# Patient Record
Sex: Female | Born: 1980 | Race: White | Hispanic: No | Smoking: Former smoker
Health system: Southern US, Community
[De-identification: ages and names within clinical notes are randomized; demographics above are authoritative.]

## PROBLEM LIST (undated history)

## (undated) DIAGNOSIS — M1990 Unspecified osteoarthritis, unspecified site: Secondary | ICD-10-CM

## (undated) DIAGNOSIS — R011 Cardiac murmur, unspecified: Secondary | ICD-10-CM

## (undated) DIAGNOSIS — R3915 Urgency of urination: Secondary | ICD-10-CM

## (undated) DIAGNOSIS — R569 Unspecified convulsions: Secondary | ICD-10-CM

## (undated) DIAGNOSIS — N3010 Interstitial cystitis (chronic) without hematuria: Secondary | ICD-10-CM

## (undated) DIAGNOSIS — I10 Essential (primary) hypertension: Secondary | ICD-10-CM

## (undated) DIAGNOSIS — B009 Herpesviral infection, unspecified: Secondary | ICD-10-CM

## (undated) DIAGNOSIS — N3289 Other specified disorders of bladder: Secondary | ICD-10-CM

## (undated) DIAGNOSIS — T7840XA Allergy, unspecified, initial encounter: Secondary | ICD-10-CM

## (undated) DIAGNOSIS — G43909 Migraine, unspecified, not intractable, without status migrainosus: Secondary | ICD-10-CM

## (undated) DIAGNOSIS — F419 Anxiety disorder, unspecified: Secondary | ICD-10-CM

## (undated) DIAGNOSIS — R350 Frequency of micturition: Secondary | ICD-10-CM

## (undated) DIAGNOSIS — R351 Nocturia: Secondary | ICD-10-CM

## (undated) DIAGNOSIS — F32A Depression, unspecified: Secondary | ICD-10-CM

## (undated) DIAGNOSIS — R102 Pelvic and perineal pain: Secondary | ICD-10-CM

## (undated) HISTORY — DX: Interstitial cystitis (chronic) without hematuria: N30.10

## (undated) HISTORY — PX: PELVIC LAPAROSCOPY: SHX162

## (undated) HISTORY — DX: Allergy, unspecified, initial encounter: T78.40XA

## (undated) HISTORY — DX: Herpesviral infection, unspecified: B00.9

## (undated) HISTORY — DX: Unspecified convulsions: R56.9

## (undated) HISTORY — PX: OTHER SURGICAL HISTORY: SHX169

## (undated) HISTORY — DX: Cardiac murmur, unspecified: R01.1

## (undated) HISTORY — DX: Unspecified osteoarthritis, unspecified site: M19.90

## (undated) HISTORY — DX: Migraine, unspecified, not intractable, without status migrainosus: G43.909

## (undated) HISTORY — DX: Depression, unspecified: F32.A

## (undated) HISTORY — DX: Anxiety disorder, unspecified: F41.9

## (undated) HISTORY — DX: Essential (primary) hypertension: I10

---
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
## 1998-09-25 ENCOUNTER — Emergency Department (HOSPITAL_COMMUNITY): Admission: EM | Admit: 1998-09-25 | Discharge: 1998-09-25 | Payer: Self-pay | Admitting: Emergency Medicine

## 1998-09-25 ENCOUNTER — Encounter: Payer: Self-pay | Admitting: Emergency Medicine

## 1999-11-26 ENCOUNTER — Emergency Department (HOSPITAL_COMMUNITY): Admission: EM | Admit: 1999-11-26 | Discharge: 1999-11-26 | Payer: Self-pay | Admitting: Emergency Medicine

## 1999-11-26 ENCOUNTER — Encounter: Payer: Self-pay | Admitting: Emergency Medicine

## 2000-10-13 ENCOUNTER — Other Ambulatory Visit: Admission: RE | Admit: 2000-10-13 | Discharge: 2000-10-13 | Payer: Self-pay | Admitting: Obstetrics and Gynecology

## 2000-12-20 ENCOUNTER — Inpatient Hospital Stay (HOSPITAL_COMMUNITY): Admission: AD | Admit: 2000-12-20 | Discharge: 2000-12-20 | Payer: Self-pay | Admitting: Obstetrics

## 2002-05-30 ENCOUNTER — Other Ambulatory Visit: Admission: RE | Admit: 2002-05-30 | Discharge: 2002-05-30 | Payer: Self-pay | Admitting: Obstetrics and Gynecology

## 2003-01-16 ENCOUNTER — Other Ambulatory Visit: Admission: RE | Admit: 2003-01-16 | Discharge: 2003-01-16 | Payer: Self-pay | Admitting: Obstetrics and Gynecology

## 2003-09-20 ENCOUNTER — Other Ambulatory Visit: Admission: RE | Admit: 2003-09-20 | Discharge: 2003-09-20 | Payer: Self-pay | Admitting: Obstetrics and Gynecology

## 2004-04-09 ENCOUNTER — Other Ambulatory Visit: Admission: RE | Admit: 2004-04-09 | Discharge: 2004-04-09 | Payer: Self-pay | Admitting: Obstetrics and Gynecology

## 2004-05-06 ENCOUNTER — Ambulatory Visit (HOSPITAL_COMMUNITY): Admission: RE | Admit: 2004-05-06 | Discharge: 2004-05-06 | Payer: Self-pay | Admitting: Obstetrics and Gynecology

## 2006-05-20 IMAGING — US US OB COMP LESS 14 WK
1 series · 14 of 28 positions shown · non-contrast
Comparison: none

CLINICAL DATA: Pelvic cramping.  Inadequate quantitative beta hCG levels.  Assess fetal life and dating.
OBSTETRICAL ULTRASOUND WITH TRANSVAGINAL:
A single intrauterine gestational sac is seen which contains an embryo with crown rump length of 5 mm, corresponding with a gestational age of 6 weeks 2 days.  No embryonic cardiac activity is seen on transvaginal sonography and this is consistent with a failed intrauterine pregnancy.  No normal appearing yolk sac is seen and the amnion is somewhat small.  There is no evidence of subchorionic hemorrhage.  No fibroids or other maternal uterine abnormalities are seen.
No adnexal masses or free fluid are identified by either transabdominal or transvaginal sonography.  The left ovary is visualized on transvaginal sonography and is normal in appearance.  The right ovary contains a small hemorrhagic corpus luteum measuring 1.6 cm, but is otherwise unremarkable.

[Series 1: us ob comp less 14 wk · 0.13mm/px · 51 acquisitions, 14 frames shown]
[im 2/51]
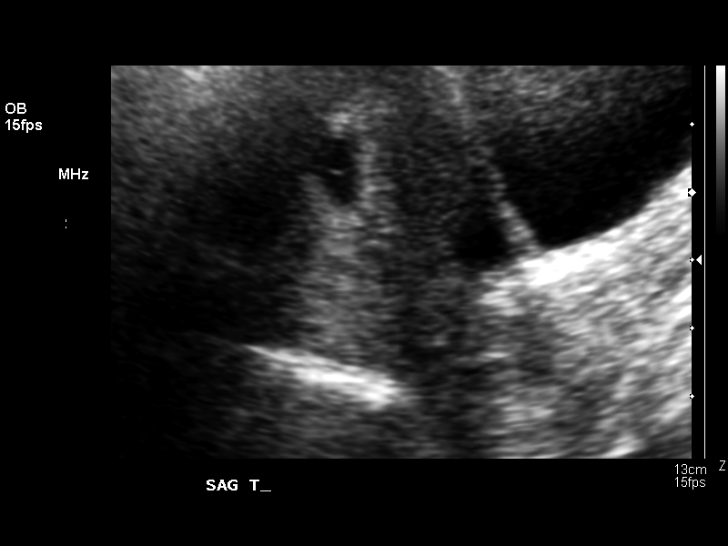
[im 6/51]
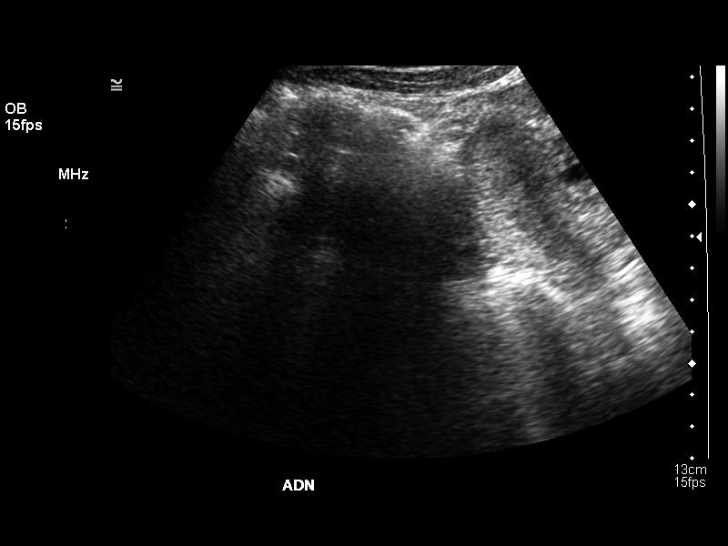
[im 10/51]
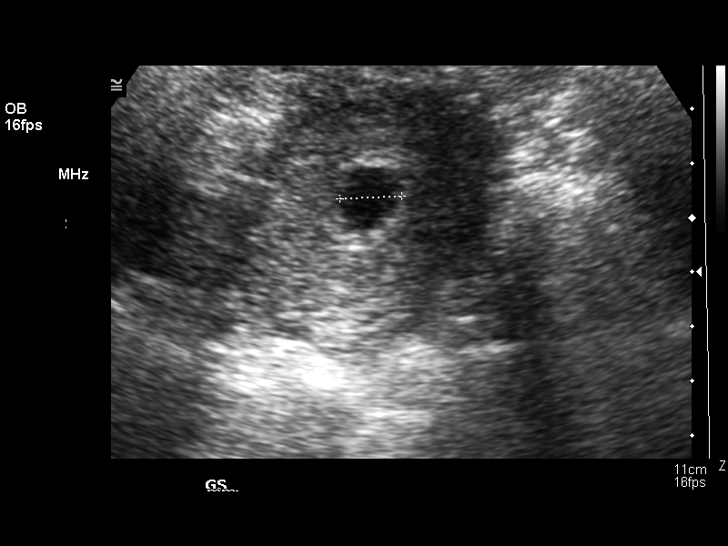
[im 13/51]
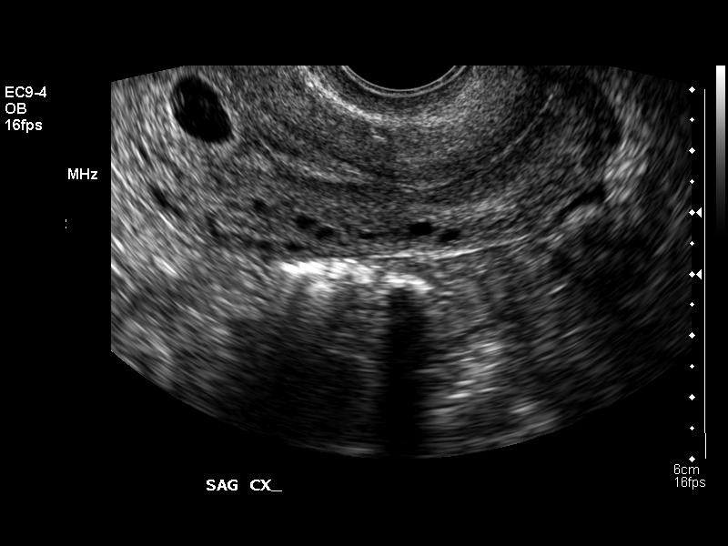
[im 17/51]
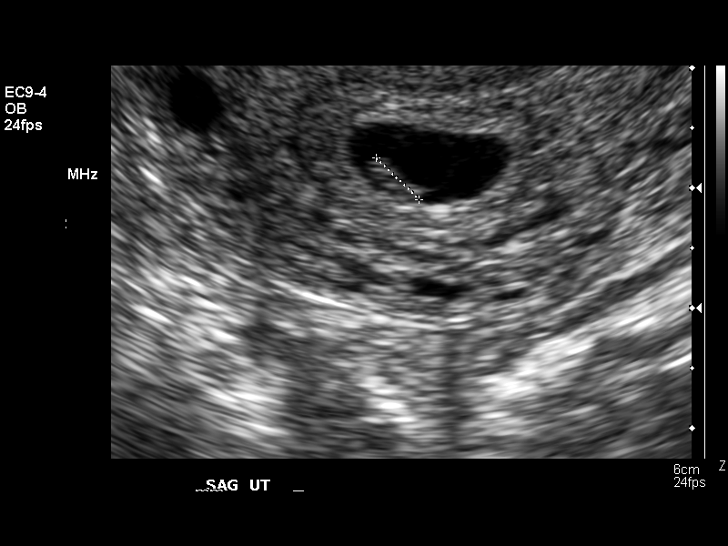
[im 21/51]
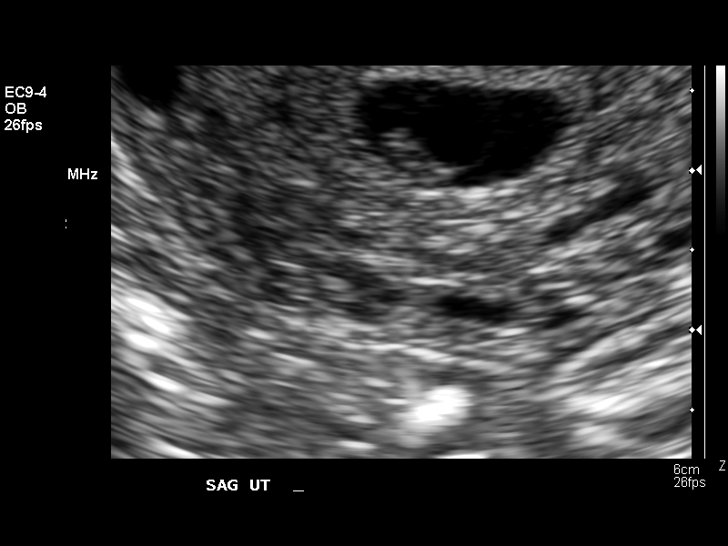
[im 25/51]
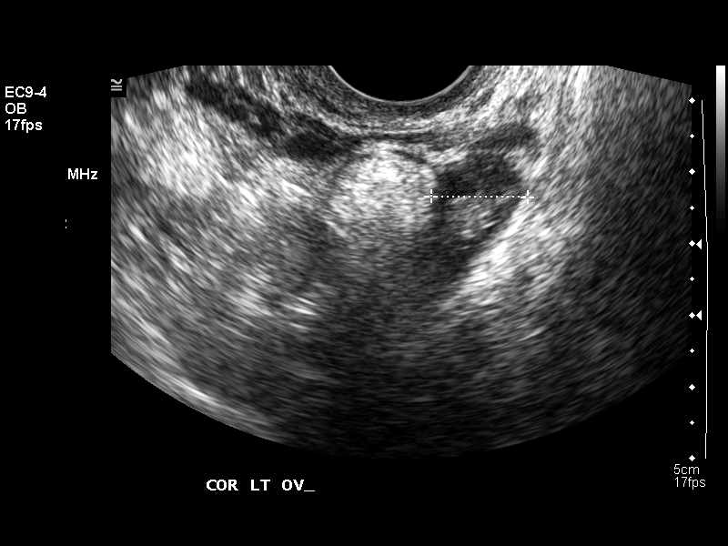
[im 28/51]
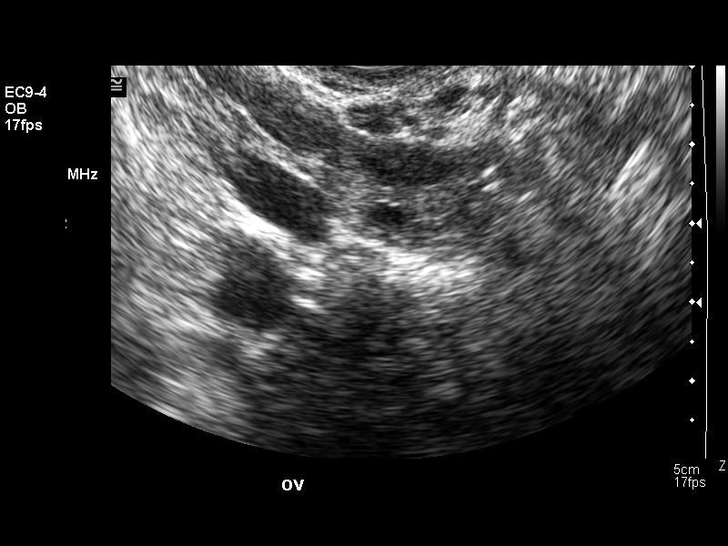
[im 32/51]
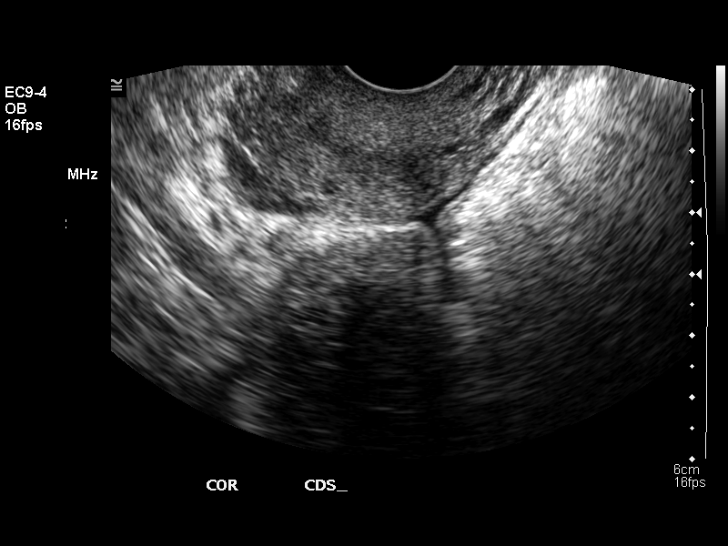
[im 36/51]
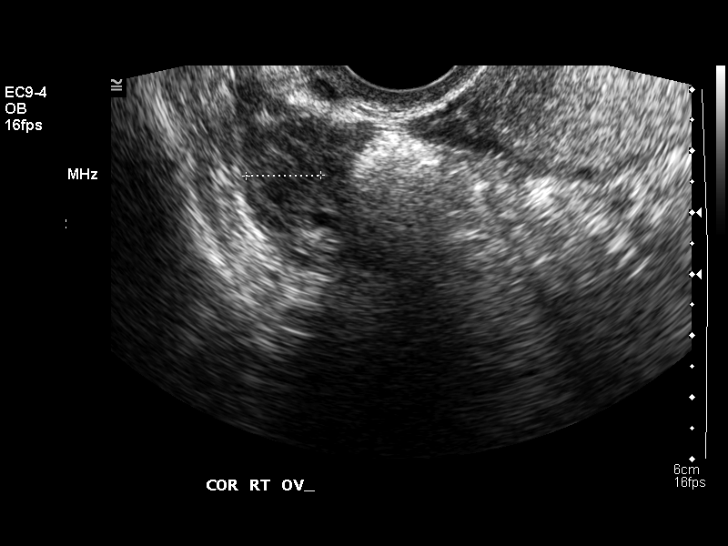
[im 39/51]
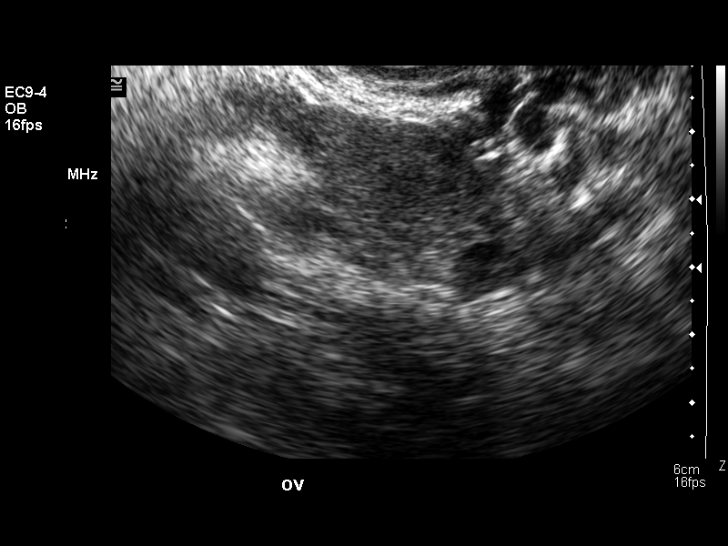
[im 43/51]
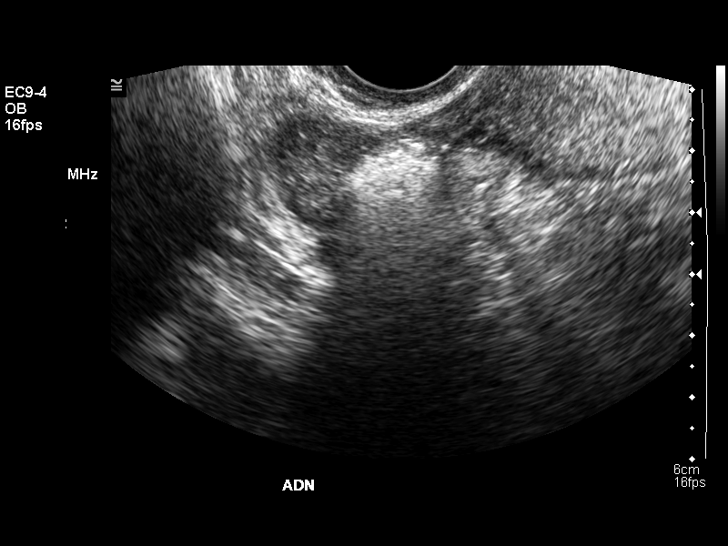
[im 47/51]
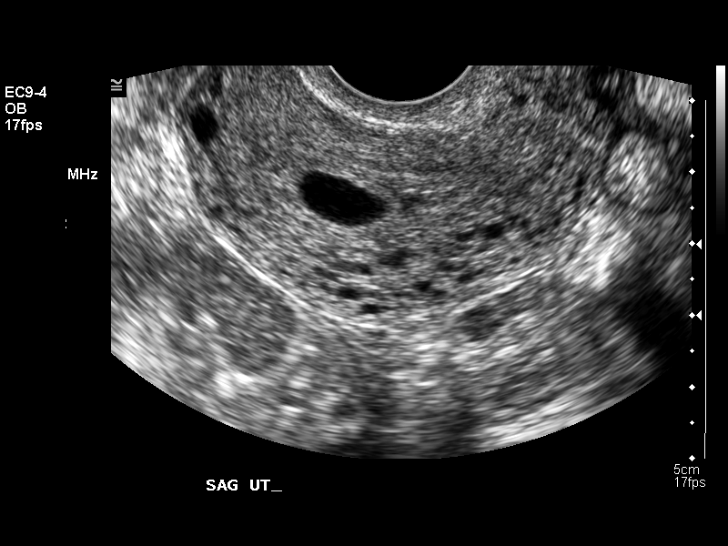
[im 51/51]
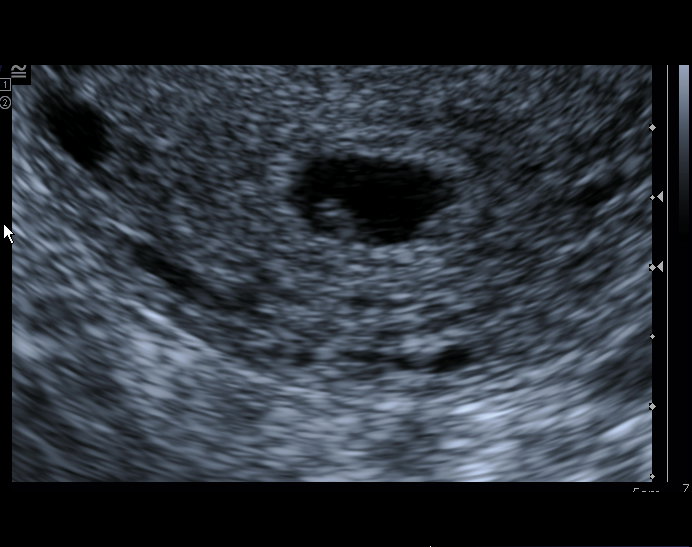

[14 of 28 positions shown; findings below may reference images not displayed]

IMPRESSION: 1.  Single intrauterine gestation with embryonic crown rump length of 5 mm, and no detectable embryonic cardiac activity.  This meets ultrasound criteria for a failed pregnancy.  Follow-up transvaginal ultrasound is suggested in two days for confirmation.  
2.  No evidence of adnexal mass or free fluid.

## 2007-01-13 HISTORY — PX: AUGMENTATION MAMMAPLASTY: SUR837

## 2008-01-13 HISTORY — PX: ECTOPIC PREGNANCY SURGERY: SHX613

## 2009-08-12 HISTORY — PX: OTHER SURGICAL HISTORY: SHX169

## 2009-12-08 ENCOUNTER — Emergency Department (HOSPITAL_COMMUNITY): Admission: EM | Admit: 2009-12-08 | Discharge: 2009-12-08 | Payer: Self-pay | Admitting: Emergency Medicine

## 2009-12-10 ENCOUNTER — Ambulatory Visit: Payer: Self-pay | Admitting: Obstetrics and Gynecology

## 2009-12-17 ENCOUNTER — Ambulatory Visit: Payer: Self-pay | Admitting: Obstetrics and Gynecology

## 2010-03-26 LAB — GC/CHLAMYDIA PROBE AMP, GENITAL
Chlamydia, DNA Probe: NEGATIVE
GC Probe Amp, Genital: NEGATIVE

## 2010-03-26 LAB — URINALYSIS, ROUTINE W REFLEX MICROSCOPIC
Bilirubin Urine: NEGATIVE
Glucose, UA: NEGATIVE mg/dL
Hgb urine dipstick: NEGATIVE
Ketones, ur: NEGATIVE mg/dL
Nitrite: NEGATIVE
Protein, ur: NEGATIVE mg/dL
Specific Gravity, Urine: 1.011 (ref 1.005–1.030)
Urobilinogen, UA: 0.2 mg/dL (ref 0.0–1.0)
pH: 7 (ref 5.0–8.0)

## 2010-03-26 LAB — WET PREP, GENITAL: Trich, Wet Prep: NONE SEEN

## 2010-03-26 LAB — POCT PREGNANCY, URINE: Preg Test, Ur: NEGATIVE

## 2010-06-12 ENCOUNTER — Institutional Professional Consult (permissible substitution) (INDEPENDENT_AMBULATORY_CARE_PROVIDER_SITE_OTHER): Payer: Managed Care, Other (non HMO) | Admitting: Obstetrics and Gynecology

## 2010-06-12 DIAGNOSIS — N801 Endometriosis of ovary: Secondary | ICD-10-CM

## 2010-06-12 DIAGNOSIS — N949 Unspecified condition associated with female genital organs and menstrual cycle: Secondary | ICD-10-CM

## 2010-06-12 DIAGNOSIS — N80109 Endometriosis of ovary, unspecified side, unspecified depth: Secondary | ICD-10-CM

## 2010-07-17 ENCOUNTER — Encounter: Payer: Self-pay | Admitting: Obstetrics and Gynecology

## 2010-07-22 ENCOUNTER — Other Ambulatory Visit: Payer: Self-pay | Admitting: Obstetrics and Gynecology

## 2010-07-22 ENCOUNTER — Other Ambulatory Visit (HOSPITAL_COMMUNITY)
Admission: RE | Admit: 2010-07-22 | Discharge: 2010-07-22 | Disposition: A | Discharge status: Home / Self Care | Payer: Managed Care, Other (non HMO) | Source: Ambulatory Visit | Attending: Obstetrics and Gynecology | Admitting: Obstetrics and Gynecology

## 2010-07-22 ENCOUNTER — Encounter (INDEPENDENT_AMBULATORY_CARE_PROVIDER_SITE_OTHER): Payer: Managed Care, Other (non HMO) | Admitting: Obstetrics and Gynecology

## 2010-07-22 DIAGNOSIS — Z13220 Encounter for screening for lipoid disorders: Secondary | ICD-10-CM

## 2010-07-22 DIAGNOSIS — Z01419 Encounter for gynecological examination (general) (routine) without abnormal findings: Secondary | ICD-10-CM

## 2010-07-22 DIAGNOSIS — Z124 Encounter for screening for malignant neoplasm of cervix: Secondary | ICD-10-CM | POA: Insufficient documentation

## 2010-09-22 ENCOUNTER — Other Ambulatory Visit: Payer: Self-pay | Admitting: Obstetrics and Gynecology

## 2010-11-07 ENCOUNTER — Other Ambulatory Visit: Payer: Self-pay | Admitting: *Deleted

## 2010-11-07 NOTE — Telephone Encounter (Signed)
Pharmacy requested refill on Fioricet.  Patient just received Sedapap #[id number]. Denied and told pharmacy to have patient call the office.

## 2010-11-12 ENCOUNTER — Other Ambulatory Visit: Payer: Self-pay | Admitting: *Deleted

## 2010-11-12 MED ORDER — NONFORMULARY OR COMPOUNDED ITEM: Status: DC

## 2010-11-12 NOTE — Telephone Encounter (Signed)
rx called in

## 2010-11-14 ENCOUNTER — Other Ambulatory Visit: Payer: Self-pay

## 2010-11-14 MED ORDER — NONFORMULARY OR COMPOUNDED ITEM: Status: DC

## 2010-11-18 ENCOUNTER — Ambulatory Visit (INDEPENDENT_AMBULATORY_CARE_PROVIDER_SITE_OTHER): Payer: Managed Care, Other (non HMO) | Admitting: Obstetrics and Gynecology

## 2010-11-18 ENCOUNTER — Encounter: Payer: Self-pay | Admitting: Obstetrics and Gynecology

## 2010-11-18 DIAGNOSIS — N644 Mastodynia: Secondary | ICD-10-CM

## 2010-11-18 DIAGNOSIS — R102 Pelvic and perineal pain unspecified side: Secondary | ICD-10-CM

## 2010-11-18 DIAGNOSIS — B373 Candidiasis of vulva and vagina: Secondary | ICD-10-CM

## 2010-11-18 DIAGNOSIS — N949 Unspecified condition associated with female genital organs and menstrual cycle: Secondary | ICD-10-CM

## 2010-11-18 DIAGNOSIS — B3731 Acute candidiasis of vulva and vagina: Secondary | ICD-10-CM

## 2010-11-18 DIAGNOSIS — N898 Other specified noninflammatory disorders of vagina: Secondary | ICD-10-CM

## 2010-11-18 MED ORDER — DANAZOL 100 MG PO CAPS: 100.0000 mg | ORAL_CAPSULE | Freq: Two times a day (BID) | ORAL | Status: AC

## 2010-11-18 MED ORDER — FLUCONAZOLE 200 MG PO TABS: 200.0000 mg | ORAL_TABLET | Freq: Every day | ORAL | Status: AC

## 2010-11-18 NOTE — Progress Notes (Signed)
Patient came to see me today for multiproblem visit. For the past 2 weeks she's had extreme discomfort behind her nipples. She does not feel any masses. Her last menstrual period just ended. She is also complaining of a vaginal discharge with itching. She has been on antibiotic for sinusitis. She's also complaining of low abnormal pain which radiates to both sides. It is not associated with nausea or vomiting or urinary symptoms. She sees Melissa Sellers who treats her for ic. She has a long history of endometriosis and has had 3 conservative  procedures in [place].  Exam: Melissa Sellers present. Breasts: Examined carefully. No masses or skin changes. Patient is status post augmentation mammoplasty. Abdomen: Soft without masses. Pelvic: External within normal limits. Vaginal reveals a discharge which is wet prep positive for yeast. Cervix is clean. Uterus is normal size and shape. Adnexa fails to reveal masses. Rectovaginal confirmatory.  Assessment: #1. Mastodynia #2. Pelvic pain #3. Yeast vaginitis  Plan: Diflucan 150 mg daily for 4 days. Discussed options for pelvic pain. Patient getting pain medicine from Melissa Sellers. Patient still reluctant to do Depo-Lupron. Patient did not do well with either NuvaRing or Mirena IUD. Discussed LAVH BSO and issues with losing her ovaries. Discussed consult with Melissa Sellers. For the moment we elected to start her on danazol 100 mg twice a day both for breast discomfort and pelvic pain. Pelvic ultrasound scheduled.

## 2010-11-21 ENCOUNTER — Encounter: Payer: Self-pay | Admitting: Gynecology

## 2010-12-01 ENCOUNTER — Ambulatory Visit (INDEPENDENT_AMBULATORY_CARE_PROVIDER_SITE_OTHER): Payer: Managed Care, Other (non HMO) | Admitting: Obstetrics and Gynecology

## 2010-12-01 ENCOUNTER — Ambulatory Visit (INDEPENDENT_AMBULATORY_CARE_PROVIDER_SITE_OTHER): Payer: Managed Care, Other (non HMO)

## 2010-12-01 DIAGNOSIS — N83209 Unspecified ovarian cyst, unspecified side: Secondary | ICD-10-CM

## 2010-12-01 DIAGNOSIS — N938 Other specified abnormal uterine and vaginal bleeding: Secondary | ICD-10-CM

## 2010-12-01 DIAGNOSIS — N949 Unspecified condition associated with female genital organs and menstrual cycle: Secondary | ICD-10-CM

## 2010-12-01 DIAGNOSIS — R102 Pelvic and perineal pain: Secondary | ICD-10-CM

## 2010-12-01 DIAGNOSIS — N809 Endometriosis, unspecified: Secondary | ICD-10-CM

## 2010-12-01 NOTE — Progress Notes (Signed)
Patient came back to see me today for pelvic ultrasound. She continues to have pelvic pain which is mostly left-sided. She also started to bleed over the weekend. It was heavier than the bleeding she had a week and a half ago. She called Dr. Lily Peer who started her on Megace 40 mg twice a day. He told me  He also called her in Ultram but patient said she didn't pick that up. The Megace stopped the bleeding but she stopped because of a headache which went away when she stopped it. She is now starting to spot again. On ultrasound today she has an anteverted uterus with a homogeneous echo pattern and no evidence of adenomyosis. Her right ovary is within normal limits. Her left ovary shows a thin walled echo-free avascular cyst between 3 and 4 cm. Her cul-de-sac shows little bit of fluid. I had started her on danazol last visit but in spite of my warning a low dose was unlikely cause side effects she elected not to take it.  Assessment: #1. Endometriosis #2. DOB #3. Left ovarian cyst  Plan: Once again we we discussed options including referral to Dr. Fredrich Birks, low dose danazol, depolupron or surgery. We have put her and ultrasound recall for 4 months. We originally discussed LAVH, BSO. Today we discussed also LAVH left S. And O. She does understand that by leaving her right ovary there is risk of recurrence but is 30 years old this might be a better decision and most of her pain has been left-sided. I will wait to hear from her period

## 2011-01-13 HISTORY — PX: OOPHORECTOMY: SHX86

## 2011-02-16 ENCOUNTER — Telehealth: Payer: Self-pay | Admitting: *Deleted

## 2011-02-16 NOTE — Telephone Encounter (Signed)
Pt called c/o yeast infection and would like rx for diflucan, I told pt that Dr. Reece Sellers is out of office and asked could she make appointment. Pt last office visit was back in Nov. Pt said she will try to treat at home with OTC. I told her that she could be seen by someone else in office or I could send message back to provider and let them know that OV was offered. Pt said she will call back after OTC treatment.

## 2011-04-20 ENCOUNTER — Telehealth: Payer: Self-pay | Admitting: *Deleted

## 2011-04-20 DIAGNOSIS — R102 Pelvic and perineal pain: Secondary | ICD-10-CM

## 2011-04-20 NOTE — Telephone Encounter (Signed)
Pt is calling c/o left side pelvic pain. LMP: 04/13/11 with lots of cramping and migraine, on Wednesday she had heavy flow, and on Thursday left side pelvic pain that throbbed off & on. On the weekend she passed clots & had dark blood. Per last office note 12/01/10 pt was to have repeat ultrasound pelvic u/s for ovarian cyst, pt never had this done. Pt called  on call doctor over the weekend Dr. Nicholas Lose and was told to let you know the above this am. Please advise

## 2011-04-20 NOTE — Telephone Encounter (Signed)
error 

## 2011-04-20 NOTE — Telephone Encounter (Signed)
Schedule ultrasound

## 2011-04-20 NOTE — Telephone Encounter (Signed)
Pt has ultrasound scheduled on 04/27/11, pt called back and is requesting pain medication until ultrasound appointment. Please advise

## 2011-04-20 NOTE — Telephone Encounter (Signed)
Ask her what she thinks would work.

## 2011-04-20 NOTE — Telephone Encounter (Signed)
Pt informed with the below, order placed in computer.

## 2011-04-20 NOTE — Telephone Encounter (Signed)
Pt said that oxycodone or percocet will help with pain.

## 2011-04-21 MED ORDER — OXYCODONE-ACETAMINOPHEN 5-325 MG PO TABS: 1.0000 | ORAL_TABLET | Freq: Four times a day (QID) | ORAL | Status: DC | PRN

## 2011-04-21 NOTE — Telephone Encounter (Signed)
rx left up font for pt to pick up.

## 2011-04-21 NOTE — Telephone Encounter (Signed)
Pt is calling to follow up with the below note.

## 2011-04-21 NOTE — Telephone Encounter (Signed)
I e- prescribed oxycodone. It appears that went through the drug store. If not please call in.

## 2011-04-21 NOTE — Telephone Encounter (Signed)
Addended by: Trellis Paganini on: 04/21/2011 10:59 AM   Modules accepted: Orders

## 2011-04-27 ENCOUNTER — Ambulatory Visit (INDEPENDENT_AMBULATORY_CARE_PROVIDER_SITE_OTHER): Payer: Managed Care, Other (non HMO)

## 2011-04-27 ENCOUNTER — Ambulatory Visit (INDEPENDENT_AMBULATORY_CARE_PROVIDER_SITE_OTHER): Payer: Managed Care, Other (non HMO) | Admitting: Obstetrics and Gynecology

## 2011-04-27 ENCOUNTER — Other Ambulatory Visit: Payer: Self-pay | Admitting: Obstetrics and Gynecology

## 2011-04-27 DIAGNOSIS — R102 Pelvic and perineal pain: Secondary | ICD-10-CM

## 2011-04-27 DIAGNOSIS — N949 Unspecified condition associated with female genital organs and menstrual cycle: Secondary | ICD-10-CM

## 2011-04-27 DIAGNOSIS — N800 Endometriosis of uterus: Secondary | ICD-10-CM

## 2011-04-27 DIAGNOSIS — N8300 Follicular cyst of ovary, unspecified side: Secondary | ICD-10-CM

## 2011-04-27 DIAGNOSIS — R1032 Left lower quadrant pain: Secondary | ICD-10-CM

## 2011-04-27 LAB — US OB TRANSVAGINAL

## 2011-04-27 MED ORDER — OXYCODONE-ACETAMINOPHEN 5-325 MG PO TABS: 1.0000 | ORAL_TABLET | Freq: Four times a day (QID) | ORAL | Status: AC | PRN

## 2011-04-27 NOTE — Progress Notes (Signed)
Patient came to see me today with a two-week history of left lower quadrant pain. Her last menstrual period started April 2. She has not been sexually active for 7 months. She has a known history of endometriosis and interstitial cystitis. She has had multiple surgeries for her endometriosis. She sees Dr. Logan Bores for her Ic. Her pain now is localized to the left lower quadrant. On ultrasound today her uterus shows cortical cystic areas consistent with adenomyosis. Her endometrial echo is 8.9 mm. Both ovaries are normal. Her cul-de-sac is free of fluid.she thinks this pain is very different from her interstitial cystitis.  Assessment: Left lower quadrant pain. Endometriosis. Interstitial cystitis.  Plan: We went over options today. We have had this discussion several times before. Options discussed included danazol-low dose, Depo-Lupron, Depo-Provera or surgery. I also offered to refer her to Dr. Fredrich Birks  At [facility]. We have discussed definitive surgery previously. She knows the down side of losing both ovaries as well as her uterus. Since she has done well until recently we also discussed laparoscopic left S&O with laser of any other endometriosis. She will inform. Refilled her Roxicet.

## 2011-05-06 ENCOUNTER — Telehealth: Payer: Self-pay | Admitting: Obstetrics and Gynecology

## 2011-05-06 NOTE — Telephone Encounter (Signed)
Patient called and said she is ready to schedule "laparoscopic surgery".  I told her Dr. Timoteo Expose note mentioned Laparoscopic removal of her Left tube and ovary and also to laser endometriosis and was that what she wanted to schedule.  She said it is. She did say she is pretty sure that left tube already been removed and it would just be the ovary on the left. She is ready to schedule whenever as pain has continued but would prefer a Friday.  Please advise.

## 2011-05-06 NOTE — Telephone Encounter (Signed)
Schedule diagnostic laparoscopy with left salpingo-oophorectomy, laser of endometriosis with yag  Laser. Schedule it [facility]. Make sure keela  there were use of light yak laser. Molli Knock do a Friday when I'm here. Diagnosis is pelvic pain with endometriosis

## 2011-05-07 NOTE — Telephone Encounter (Signed)
Will you need to have preoperative consultation with patient?

## 2011-05-07 NOTE — Telephone Encounter (Signed)
Yes

## 2011-05-14 ENCOUNTER — Ambulatory Visit (INDEPENDENT_AMBULATORY_CARE_PROVIDER_SITE_OTHER): Payer: Managed Care, Other (non HMO) | Admitting: Obstetrics and Gynecology

## 2011-05-14 DIAGNOSIS — R1032 Left lower quadrant pain: Secondary | ICD-10-CM

## 2011-05-14 DIAGNOSIS — N809 Endometriosis, unspecified: Secondary | ICD-10-CM

## 2011-05-14 NOTE — Progress Notes (Signed)
Patient came to see me today to discuss surgery. For the past 3 months she's been having persistent left lower quadrant pain. She has a history of endometriosis which she has had several procedures for. Ultrasound in our office in April failed to reveal pathology in either ovary. Uterus does show changes consistent with adenomyosis. We have tentatively scheduled her for  left salpingo-oophorectomy. We discussed today the  already scheduled surgery as opposed to vaginal hysterectomy with left salpingo-oophorectomy. I have explained to her that in terms of long-term pain relief removing her uterus as well as the left ovary and tube might give her better pain relief. She does have a daughter but is currently going through a divorce. She has already had a right salpingectomy for an ectopic pregnancy. She does not rule out future pregnancies although she said that is certainly not important to her. She does appreciate that even if I leave her uterus if we remove her left ovary and fallopian tube for pain relief her only way to get pregnant with the right ovary and uterus would be in vitro fertilization. She is very uncomfortable and wants her left ovary and fallopian tube removed even though  she understands how that would compromise future fertility. We discussed the fact if we took her uterus out as well there is a greater risk for complication and longer recuperation. She needs to continue to work and does not want a longer recuperation and therefore would like to just proceed with left salpingo-oophorectomy and laser of any other endometriosis found. She understands that I cannot guarantee pain relief. We discussed normal recuperation from surgery. She will avoid NSAIDs and aspirin before surgery. We discussed complications including bowel or blood vessel injury or urethral injury and need for additional surgery. We will use pulsatile stockings. We plan to do this as day surgery.

## 2011-05-19 ENCOUNTER — Encounter (HOSPITAL_BASED_OUTPATIENT_CLINIC_OR_DEPARTMENT_OTHER): Payer: Self-pay | Admitting: *Deleted

## 2011-05-19 NOTE — Progress Notes (Signed)
NPO AFTER MN. ARRIVES AT 0600. NEEDS CBC AND SERUM PREG. MAY TAKE PAIN RX IF NEEDED W/ SIP OF WATER.

## 2011-05-20 NOTE — H&P (Signed)
  Chief complaint: Left lower quadrant pain. History of present illness: Patient is a 31 year old gravida 2 para 1 Ab1( ectopic pregnancy) who has now been having chronic pain in her left lower quadrant 3+ months. Patient has a known history of endometriosis and has undergone laparoscopy with excision of endometriosis in 2008 and laparoscopy with excision of endometriosis and presacral neurectomy in 2010. She also had an ectopic pregnancy involving her right fallopian tube in 2010. She has been treated for endometriosis conservatively with oral progestins,  Nuvaring, and  Mirena IUD. She has also been offered Depo-Lupron and danazol which she declined because of concern with side effects. In spite of the above she continues to have pain. She does not feel she will have a second child. She is currently separated and is going to proceed with divorce. After very long discussions we have elected to do a laparoscopy and remove her left ovary and fallopian tube which is where all her pain is. We will also laser any other endometriosis found. She understands that this will not guarantee pain relief or risk of recurrence of pain. She has elected not to proceed with hysterectomy as well although on ultrasound her uterus shows changes consistent with adenomyosis. Her reasoning for this is the  recuperation and her need to return to work promptly. She does appreciate that since she already  had her right fallopian tube removed that after this surgery she would only be able to conceive with in vitro fertilization. We have discussed possible complications from the surgery and other nonsurgical treatments. She now enters for the above.  Past medical history, family history, social history, and review of systems all in epic record and reviewed.  Physical examination: HEENT within normal limits. Neck: Thyroid not large. No masses. Supraclavicular nodes: not enlarged. Breasts: Examined in both sitting and lying  position.  No skin changes and no masses. Abdomen: Soft no guarding rebound or masses or hernia. Pelvic: External: Within normal limits. BUS: Within normal limits. Vaginal:within normal limits. Good estrogen effect. No evidence of cystocele rectocele or enterocele. Cervix: clean. Uterus: Normal size and shape. Adnexa: No masses. Rectovaginal exam: Confirmatory and negative. Extremities: Within normal limits.  Assessment: Symptomatic endometriosis  Plan: Diagnostic laparoscopy with left salpingo-oophorectomy. Laser of other endometriosis found.

## 2011-05-21 NOTE — Progress Notes (Signed)
Called regarding sinus pressure and if could take her zyrtec and benadryl-denies fever or cough-instructed OK to use today-increase fluid intake and call Dr Eda Paschal if fever occurs or symptoms change.

## 2011-05-22 ENCOUNTER — Ambulatory Visit (HOSPITAL_BASED_OUTPATIENT_CLINIC_OR_DEPARTMENT_OTHER)
Admission: RE | Admit: 2011-05-22 | Discharge: 2011-05-22 | Disposition: A | Discharge status: Home / Self Care | Payer: Managed Care, Other (non HMO) | Source: Ambulatory Visit | Attending: Obstetrics and Gynecology | Admitting: Obstetrics and Gynecology

## 2011-05-22 ENCOUNTER — Encounter (HOSPITAL_BASED_OUTPATIENT_CLINIC_OR_DEPARTMENT_OTHER)
Admission: RE | Disposition: A | Discharge status: Home / Self Care | Payer: Self-pay | Source: Ambulatory Visit | Attending: Obstetrics and Gynecology

## 2011-05-22 ENCOUNTER — Encounter (HOSPITAL_BASED_OUTPATIENT_CLINIC_OR_DEPARTMENT_OTHER): Payer: Self-pay | Admitting: *Deleted

## 2011-05-22 ENCOUNTER — Encounter (HOSPITAL_BASED_OUTPATIENT_CLINIC_OR_DEPARTMENT_OTHER): Payer: Self-pay | Admitting: Anesthesiology

## 2011-05-22 ENCOUNTER — Ambulatory Visit (HOSPITAL_BASED_OUTPATIENT_CLINIC_OR_DEPARTMENT_OTHER): Payer: Managed Care, Other (non HMO) | Admitting: Anesthesiology

## 2011-05-22 DIAGNOSIS — N801 Endometriosis of ovary: Secondary | ICD-10-CM

## 2011-05-22 DIAGNOSIS — Z9079 Acquired absence of other genital organ(s): Secondary | ICD-10-CM | POA: Insufficient documentation

## 2011-05-22 DIAGNOSIS — R1032 Left lower quadrant pain: Secondary | ICD-10-CM

## 2011-05-22 DIAGNOSIS — N8300 Follicular cyst of ovary, unspecified side: Secondary | ICD-10-CM | POA: Insufficient documentation

## 2011-05-22 DIAGNOSIS — N80109 Endometriosis of ovary, unspecified side, unspecified depth: Secondary | ICD-10-CM

## 2011-05-22 DIAGNOSIS — N831 Corpus luteum cyst of ovary, unspecified side: Secondary | ICD-10-CM | POA: Insufficient documentation

## 2011-05-22 HISTORY — DX: Pelvic and perineal pain: R10.2

## 2011-05-22 HISTORY — DX: Other specified disorders of bladder: N32.89

## 2011-05-22 HISTORY — DX: Frequency of micturition: R35.0

## 2011-05-22 HISTORY — DX: Nocturia: R35.1

## 2011-05-22 HISTORY — DX: Urgency of urination: R39.15

## 2011-05-22 LAB — CBC
HCT: 38.6 % (ref 36.0–46.0)
Hemoglobin: 13.1 g/dL (ref 12.0–15.0)
MCHC: 33.9 g/dL (ref 30.0–36.0)
MCV: 93.9 fL (ref 78.0–100.0)
RDW: 12.1 % (ref 11.5–15.5)
WBC: 4.9 10*3/uL (ref 4.0–10.5)

## 2011-05-22 SURGERY — Surgical Case
Anesthesia: *Unknown

## 2011-05-22 SURGERY — SALPINGO-OOPHORECTOMY, LAPAROSCOPIC
Anesthesia: General | Site: Uterus | Laterality: Left | Wound class: Clean Contaminated

## 2011-05-22 MED ORDER — HEPARIN SOD (PORK) LOCK FLUSH 100 UNIT/ML IV SOLN
INTRAVENOUS | Status: DC | PRN
  Administered 2011-05-22: 100 [IU]

## 2011-05-22 MED ORDER — NEOSTIGMINE METHYLSULFATE 1 MG/ML IJ SOLN
INTRAMUSCULAR | Status: DC | PRN
  Administered 2011-05-22: 4 mg via INTRAVENOUS

## 2011-05-22 MED ORDER — LACTATED RINGERS IV SOLN
INTRAVENOUS | Status: DC
  Administered 2011-05-22: 07:00:00 via INTRAVENOUS

## 2011-05-22 MED ORDER — PROMETHAZINE HCL 25 MG/ML IJ SOLN
6.2500 mg | INTRAMUSCULAR | Status: DC | PRN
  Administered 2011-05-22: 6.25 mg via INTRAVENOUS

## 2011-05-22 MED ORDER — ROCURONIUM BROMIDE 100 MG/10ML IV SOLN
INTRAVENOUS | Status: DC | PRN
  Administered 2011-05-22: 40 mg via INTRAVENOUS

## 2011-05-22 MED ORDER — CEFAZOLIN SODIUM 1-5 GM-% IV SOLN: 1.0000 g | INTRAVENOUS | Status: DC

## 2011-05-22 MED ORDER — GLYCOPYRROLATE 0.2 MG/ML IJ SOLN
INTRAMUSCULAR | Status: DC | PRN
  Administered 2011-05-22: 0.6 mg via INTRAVENOUS

## 2011-05-22 MED ORDER — CEFAZOLIN SODIUM 1-5 GM-% IV SOLN
INTRAVENOUS | Status: DC | PRN
  Administered 2011-05-22: 1 g via INTRAVENOUS

## 2011-05-22 MED ORDER — ONDANSETRON HCL 4 MG/2ML IJ SOLN
INTRAMUSCULAR | Status: DC | PRN
  Administered 2011-05-22: 4 mg via INTRAVENOUS

## 2011-05-22 MED ORDER — LIDOCAINE HCL (CARDIAC) 20 MG/ML IV SOLN
INTRAVENOUS | Status: DC | PRN
  Administered 2011-05-22: 100 mg via INTRAVENOUS

## 2011-05-22 MED ORDER — FENTANYL CITRATE 0.05 MG/ML IJ SOLN
INTRAMUSCULAR | Status: DC | PRN
  Administered 2011-05-22: 25 ug via INTRAVENOUS
  Administered 2011-05-22 (×2): 50 ug via INTRAVENOUS
  Administered 2011-05-22: 25 ug via INTRAVENOUS

## 2011-05-22 MED ORDER — LACTATED RINGERS IV SOLN
INTRAVENOUS | Status: DC
  Administered 2011-05-22: 10:00:00 via INTRAVENOUS

## 2011-05-22 MED ORDER — MEPERIDINE HCL 25 MG/ML IJ SOLN: 6.2500 mg | INTRAMUSCULAR | Status: DC | PRN

## 2011-05-22 MED ORDER — PROPOFOL 10 MG/ML IV EMUL
INTRAVENOUS | Status: DC | PRN
  Administered 2011-05-22: 160 mg via INTRAVENOUS

## 2011-05-22 MED ORDER — MIDAZOLAM HCL 5 MG/5ML IJ SOLN
INTRAMUSCULAR | Status: DC | PRN
  Administered 2011-05-22: 2 mg via INTRAVENOUS

## 2011-05-22 MED ORDER — HYDROMORPHONE HCL PF 1 MG/ML IJ SOLN
0.2500 mg | INTRAMUSCULAR | Status: DC | PRN
  Administered 2011-05-22 (×4): 0.25 mg via INTRAVENOUS

## 2011-05-22 MED ORDER — KETOROLAC TROMETHAMINE 30 MG/ML IJ SOLN
INTRAMUSCULAR | Status: DC | PRN
  Administered 2011-05-22: 30 mg via INTRAVENOUS

## 2011-05-22 MED ORDER — LACTATED RINGERS IR SOLN
Status: DC | PRN
  Administered 2011-05-22: 3000 mL

## 2011-05-22 MED ORDER — LACTATED RINGERS IV SOLN
INTRAVENOUS | Status: DC | PRN
  Administered 2011-05-22 (×2): via INTRAVENOUS

## 2011-05-22 SURGICAL SUPPLY — 62 items
ADH SKN CLS APL DERMABOND .7 (GAUZE/BANDAGES/DRESSINGS) ×1
APL SKNCLS STERI-STRIP NONHPOA (GAUZE/BANDAGES/DRESSINGS) ×1
APPLICATOR COTTON TIP 6IN STRL (MISCELLANEOUS) ×2 IMPLANT
BAG SPEC RTRVL LRG 6X4 10 (ENDOMECHANICALS) ×1
BAG URINE DRAINAGE (UROLOGICAL SUPPLIES) ×1 IMPLANT
BANDAGE ADHESIVE 1X3 (GAUZE/BANDAGES/DRESSINGS) IMPLANT
BENZOIN TINCTURE PRP APPL 2/3 (GAUZE/BANDAGES/DRESSINGS) ×2 IMPLANT
BLADE SURG 11 STRL SS (BLADE) ×2 IMPLANT
CANISTER SUCTION 1200CC (MISCELLANEOUS) IMPLANT
CANISTER SUCTION 2500CC (MISCELLANEOUS) ×1 IMPLANT
CATH FOLEY 2WAY  5CC 16FR SIL (CATHETERS) ×1
CATH FOLEY 2WAY 5CC 16FR SIL (CATHETERS) IMPLANT
CATH ROBINSON RED A/P 16FR (CATHETERS) IMPLANT
CLOTH BEACON ORANGE TIMEOUT ST (SAFETY) ×2 IMPLANT
DERMABOND ADVANCED (GAUZE/BANDAGES/DRESSINGS) ×1
DERMABOND ADVANCED .7 DNX12 (GAUZE/BANDAGES/DRESSINGS) IMPLANT
DRAPE CAMERA CLOSED 9X96 (DRAPES) ×2 IMPLANT
DRAPE UNDERBUTTOCKS STRL (DRAPE) ×2 IMPLANT
DRESSING TELFA 8X3 (GAUZE/BANDAGES/DRESSINGS) IMPLANT
ELECT REM PT RETURN 9FT ADLT (ELECTROSURGICAL) ×2
ELECTRODE REM PT RTRN 9FT ADLT (ELECTROSURGICAL) ×1 IMPLANT
FILTER SMOKE EVAC LAPAROSHD (FILTER) ×2 IMPLANT
FORCEPS BIOP CUTTING (INSTRUMENTS) IMPLANT
GAS CARTRIDGE (MEDICAL GASES) IMPLANT
GLOVE ECLIPSE 7.0 STRL STRAW (GLOVE) ×4 IMPLANT
GLOVE INDICATOR 7.5 STRL GRN (GLOVE) ×2 IMPLANT
GLOVE SKINSENSE NS SZ7.5 (GLOVE) ×1
GLOVE SKINSENSE STRL SZ7.5 (GLOVE) IMPLANT
GOWN PREVENTION PLUS LG XLONG (DISPOSABLE) ×2 IMPLANT
LAPAROSCOPY HANDPIECE LONG (MISCELLANEOUS) IMPLANT
LIGASURE 5MM LAPAROSCOPIC (INSTRUMENTS) IMPLANT
NS IRRIG 500ML POUR BTL (IV SOLUTION) ×1 IMPLANT
PACK BASIN DAY SURGERY FS (CUSTOM PROCEDURE TRAY) ×2 IMPLANT
PACK LAPAROSCOPY II (CUSTOM PROCEDURE TRAY) ×2 IMPLANT
PAD OB MATERNITY 4.3X12.25 (PERSONAL CARE ITEMS) ×2 IMPLANT
PAD PREP 24X48 CUFFED NSTRL (MISCELLANEOUS) ×2 IMPLANT
POUCH SPECIMEN RETRIEVAL 10MM (ENDOMECHANICALS) ×1 IMPLANT
SCALPEL HARMONIC ACE (MISCELLANEOUS) IMPLANT
SCISSORS LAP 5X35 DISP (ENDOMECHANICALS) IMPLANT
SEALER TISSUE G2 CVD JAW 35 (ENDOMECHANICALS) IMPLANT
SEALER TISSUE G2 CVD JAW 45CM (ENDOMECHANICALS) ×1
SET IRRIG TUBING LAPAROSCOPIC (IRRIGATION / IRRIGATOR) IMPLANT
SOLUTION ANTI FOG 6CC (MISCELLANEOUS) ×3 IMPLANT
SOLUTION ELECTROLUBE (MISCELLANEOUS) IMPLANT
STRIP CLOSURE SKIN 1/4X4 (GAUZE/BANDAGES/DRESSINGS) IMPLANT
SUT MNCRL AB 3-0 PS2 18 (SUTURE) ×1 IMPLANT
SUT MON AB 3-0 SH 27 (SUTURE) ×1 IMPLANT
SUT VICRYL 0 UR6 27IN ABS (SUTURE) ×3 IMPLANT
SYR 3ML 23GX1 SAFETY (SYRINGE) IMPLANT
SYRINGE 10CC LL (SYRINGE) IMPLANT
TOWEL OR 17X24 6PK STRL BLUE (TOWEL DISPOSABLE) ×4 IMPLANT
TRAY DSU PREP LF (CUSTOM PROCEDURE TRAY) ×3 IMPLANT
TROCAR 12M 150ML BLUNT (TROCAR) IMPLANT
TROCAR 5M 150ML BLDLS (TROCAR) IMPLANT
TROCAR BLADED SMOOTH C0639 (ENDOMECHANICALS) IMPLANT
TROCAR CANNULA 5X100MM C0Q10 (TROCAR) IMPLANT
TROCAR XCEL BLUNT TIP 100MML (ENDOMECHANICALS) IMPLANT
TROCAR XCEL NON-BLD 11X100MML (ENDOMECHANICALS) ×2 IMPLANT
TROCAR Z-THREAD FIOS 5X100MM (TROCAR) ×3 IMPLANT
TUBING INSUFFLATION W/FILTER (TUBING) ×2 IMPLANT
VACUUM HOSE/TUBING 7/8INX6FT (MISCELLANEOUS) IMPLANT
WATER STERILE IRR 500ML POUR (IV SOLUTION) ×3 IMPLANT

## 2011-05-22 NOTE — Anesthesia Preprocedure Evaluation (Addendum)
Anesthesia Evaluation  Patient identified by MRN, date of birth, ID band Patient awake    Reviewed: Allergy & Precautions, H&P , NPO status , Patient's Chart, lab work & pertinent test results  Airway Mallampati: II TM Distance: >3 FB Neck ROM: Full    Dental No notable dental hx.    Pulmonary neg pulmonary ROS, Current Smoker,  breath sounds clear to auscultation  Pulmonary exam normal       Cardiovascular negative cardio ROS  Rhythm:Regular Rate:Normal     Neuro/Psych negative neurological ROS  negative psych ROS   GI/Hepatic negative GI ROS, Neg liver ROS,   Endo/Other  negative endocrine ROS  Renal/GU negative Renal ROS  negative genitourinary   Musculoskeletal negative musculoskeletal ROS (+)   Abdominal   Peds negative pediatric ROS (+)  Hematology negative hematology ROS (+)   Anesthesia Other Findings   Reproductive/Obstetrics negative OB ROS                           Anesthesia Physical Anesthesia Plan  ASA: II  Anesthesia Plan: General   Post-op Pain Management:    Induction: Intravenous  Airway Management Planned: Oral ETT  Additional Equipment:   Intra-op Plan:   Post-operative Plan: Extubation in OR  Informed Consent: I have reviewed the patients History and Physical, chart, labs and discussed the procedure including the risks, benefits and alternatives for the proposed anesthesia with the patient or authorized representative who has indicated his/her understanding and acceptance.   Dental advisory given  Plan Discussed with: CRNA  Anesthesia Plan Comments:         Anesthesia Quick Evaluation  

## 2011-05-22 NOTE — Anesthesia Postprocedure Evaluation (Signed)
  Anesthesia Post-op Note  Patient: Melissa Sellers  Procedure(s) Performed: Procedure(s) (LRB): LAPAROSCOPIC SALPINGO OOPHERECTOMY (Left)  Patient Location: PACU  Anesthesia Type: General  Level of Consciousness: awake and alert   Airway and Oxygen Therapy: Patient Spontanous Breathing  Post-op Pain: mild  Post-op Assessment: Post-op Vital signs reviewed, Patient's Cardiovascular Status Stable, Respiratory Function Stable, Patent Airway and No signs of Nausea or vomiting  Post-op Vital Signs: stable  Complications: No apparent anesthesia complications

## 2011-05-22 NOTE — Anesthesia Procedure Notes (Signed)
Procedure Name: Intubation Date/Time: 05/22/2011 7:43 AM Performed by: Jessica Priest Pre-anesthesia Checklist: Patient identified, Emergency Drugs available, Suction available and Patient being monitored Patient Re-evaluated:Patient Re-evaluated prior to inductionOxygen Delivery Method: Circle System Utilized Preoxygenation: Pre-oxygenation with 100% oxygen Intubation Type: IV induction Ventilation: Mask ventilation without difficulty Laryngoscope Size: Mac and 3 Grade View: Grade I Tube type: Oral Tube size: 7.0 mm Number of attempts: 1 Airway Equipment and Method: stylet,  oral airway and LTA kit utilized Placement Confirmation: ETT inserted through vocal cords under direct vision,  positive ETCO2 and breath sounds checked- equal and bilateral Secured at: 22 cm Tube secured with: Tape Dental Injury: Teeth and Oropharynx as per pre-operative assessment

## 2011-05-22 NOTE — Interval H&P Note (Signed)
History and Physical Interval Note:  05/22/2011 7:16 AM  Melissa Sellers  has presented today for surgery, with the diagnosis of pelvic pain, endometriosis  The various methods of treatment have been discussed with the patient and family. After consideration of risks, benefits and other options for treatment, the patient has consented to  Procedure(s) (LRB): LAPAROSCOPIC SALPINGO OOPHERECTOMY (Left) as a surgical intervention .  The patients' history has been reviewed, patient examined, no change in status, stable for surgery.  I have reviewed the patients' chart and labs.  Questions were answered to the patient's satisfaction.     [name]

## 2011-05-22 NOTE — Interval H&P Note (Signed)
History and Physical Interval Note:  05/22/2011 7:14 AM  Melissa Sellers  has presented today for surgery, with the diagnosis of pelvic pain, endometriosis  The various methods of treatment have been discussed with the patient and family. After consideration of risks, benefits and other options for treatment, the patient has consented to  Procedure(s) (LRB): LAPAROSCOPIC SALPINGO OOPHERECTOMY (Left) as a surgical intervention .  The patients' history has been reviewed, patient examined, no change in status, stable for surgery.  I have reviewed the patients' chart and labs.  Questions were answered to the patient's satisfaction.     [name] L

## 2011-05-22 NOTE — Discharge Instructions (Addendum)
HOME CARE INSTRUCTIONS - LAPAROSCOPY  Wound Care:   The band aids or dressings which are placed on the skin opening may be removed the day after surgery.  The incision should be kept clean and dry.  The stitches do not need to be removed.  Should the incision become sore, red and swollen after the first week, check with your doctor.  Persona Hygiene: Shower or tub bathe the day after your procedure.  Always wipe from front to back after elimination.  Activity: Do not drive or operate any equipment today.  The effects of the anesthesia are sill present and drowsiness may result.  Rest today - not necessarily flat bed rest.  Just take it easy.  You may resume your normal activity in two to three days or as instructed by your physician.  Sexual Activity: You may resume sexual activity as indicated by your physician.  If your laparoscopy was for a sterilization (tubes tied), continue current method of birth control until after your next period or ask for specific instructions from your doctor.  Diet: Eat a light diet as desired this evening.  You may resume a regular diet tomorrow.   Return to Work: You may return to work in tow or three days, or as indicated by your physician.  General Expectations of Your Surgery: Your surgery will cause vaginal drainage or spotting which may continue for 2-3 days.  Mild abdominal discomfort or tenderness is not unusual and some shoulder pain may also be noted, which can be relieved by lying flat in bed.  Unexpected Observations - Call Your Doctor if These Occur:  -persistent or heavy bleeding at incision site  -redness or swellling around incision  -elevation of temperature greater than 100 degrees Farenheit.  Return to your Doctor's Office:  1 week Call to set up an appointment.  Patient's Signature:  _______________________________________________________  Nurse's Signature:  ________________________________________________________   Post  Anesthesia Home Care Instructions  Activity: Get plenty of rest for the remainder of the day. A responsible adult should stay with you for 24 hours following the procedure.  For the next 24 hours, DO NOT: -Drive a car -Operate machinery -Drink alcoholic beverages -Take any medication unless instructed by your physician -Make any legal decisions or sign important papers.  Meals: Start with liquid foods such as gelatin or soup. Progress to regular foods as tolerated. Avoid greasy, spicy, heavy foods. If nausea and/or vomiting occur, drink only clear liquids until the nausea and/or vomiting subsides. Call your physician if vomiting continues.  Special Instructions/Symptoms: Your throat may feel dry or sore from the anesthesia or the breathing tube placed in your throat during surgery. If this causes discomfort, gargle with warm salt water. The discomfort should disappear within 24 hours.   

## 2011-05-22 NOTE — Op Note (Signed)
Date of procedure: 05/22/2011 Preoperative diagnosis: Symptomatic endometriosis with left lower quadrant pain Postoperative diagnoses: Same Operation: Diagnostic laparoscopy with left salpingo-oophorectomy. Surgeon: Dr. Eda Paschal First Asst.: Dr. Audie Box Anesthesia: Gen. Endotracheal  Findings: Patient's uterus was anteverted and no endometriosis was visible. Right ovary was completely normal without evidence of endometriosis. Right fallopian tube was surgically absent. Left ovary and fallopian tube were free and mobile and no obvious endometriosis was visualized. Pelvic peritoneum was carefully inspected and was without endometriosis. There were no pelvic adhesions. Ileocecal junction was identified and the appendix was normal. Right upper quadrant was visualized and was completely normal and once again without endometriosis.  Procedure: The patient was taken to the operating room and given general endotracheal anesthesia. She was placed in the dorsolithotomy position and prepped and draped in usual sterile manner. A Foley catheter was placed in the bladder. A Hulka catheter was placed in the uterus. A subumbilical vertical incision was made and the diagnostic laparoscope was introduced by direct puncture technique using an Optiview under visualization. It was an atraumatic entry to the peritoneal cavity. Two 5 mm ports were placed in the right and left lower quadrant. The pelvis was carefully inspected and was as noted above. The ureters were identified bilaterally. The left adnexa was elevated. The infundibular pelvic ligament, broad ligament, utero-ovarian ligament, and proximal fallopian tube on the left were cauterized and cut with the Enseal without any bleeding. The left adnexa was now free. A 5 mm laparoscope was placed in the left lower quadrant. An Endopouch was placed subumbilically. The specimen was placed in the Endopouch and removed. It was sent to pathology for tissue diagnosis. The pelvis  was copiously irrigated with sterile saline. There was no bleeding noted. All trochars were removed. The pneumoperitoneum was evacuated. The subumbilical fascial incision was closed with 0 Vicryl. Dermabond was used on the skin of all 3 incisions. The Foley catheter was removed. The Hulka catheter was removed. The patient left the operating room in satisfactory condition.

## 2011-05-22 NOTE — Transfer of Care (Signed)
Immediate Anesthesia Transfer of Care Note  Patient: Melissa Sellers  Procedure(s) Performed: Procedure(s) (LRB): LAPAROSCOPIC SALPINGO OOPHERECTOMY (Left)  Patient Location: PACU  Anesthesia Type: General  Level of Consciousness: awake, sedated, patient cooperative and responds to stimulation  Airway & Oxygen Therapy: Patient Spontanous Breathing and Patient connected to nasal cannula O2 Post-op Assessment: Report given to PACU RN, Post -op Vital signs reviewed and stable and Patient moving all extremities  Post vital signs: Reviewed and stable  Complications: No apparent anesthesia complications

## 2011-05-22 NOTE — Pre-Procedure Instructions (Signed)
Date of Initial H&P: 05/20/2011  History reviewed, patient examined, no change in status, stable for surgery.

## 2011-05-25 ENCOUNTER — Telehealth: Payer: Self-pay | Admitting: Obstetrics and Gynecology

## 2011-05-25 ENCOUNTER — Encounter: Payer: Self-pay | Admitting: Obstetrics and Gynecology

## 2011-05-25 ENCOUNTER — Telehealth: Payer: Self-pay | Admitting: *Deleted

## 2011-05-25 MED ORDER — AZITHROMYCIN 250 MG PO TABS: ORAL_TABLET | ORAL | Status: AC

## 2011-05-25 NOTE — Telephone Encounter (Signed)
PT IS POST OP LAPAROSCOPIC SALPINGO OOPHERECTOMY 05/22/11 PT SAID SHE IS DOING WELL, BUT HAS A SINUS INFECTION THAT SHE HAS PRIOR TO SURGERY. PT IS REQUESTING Z-PACK IF POSSIBLE TO HELP RELIEVE. PLEASE ADVISE

## 2011-05-25 NOTE — Telephone Encounter (Signed)
Patient had Diagnostic Lap LSO on Friday.  She said her job was expecting her back today because she thought you had indicated she would be up and around and able to go back today.  She did not feel up to it.  Her job asked her to call us and insist that we give her note to return to work tomorrow but patient does not feel up to that. She asked for note stating that she has post op appt with Dr. Reece Agar on Weds and can tentatively plan return to work Thursday 5/16 if you okay it at that visit. I have readied that letter for your signature and I will fax it to her employer.  She asked if vaginal bleeding normal?  She had just finished her period a few days before surgery but after surgery she has begun bleeding like a period.  She had a little blood from umbilicus last night but she attributed that to hug from 40 year old daughter. None since.  Patient asked if I knew why you made the incisions higher?  She said one at umbilicus and others above hip bone?  Pls advise- Thanks

## 2011-05-25 NOTE — Telephone Encounter (Signed)
PT SAID THAT Z-PACK WORKS GOOD FOR HER.

## 2011-05-25 NOTE — Telephone Encounter (Signed)
Okay to give her Z-Pak

## 2011-05-25 NOTE — Telephone Encounter (Signed)
Letter was fine. Bleeding was normal. Incisions were placed where they were to allow removal of ovary.

## 2011-05-25 NOTE — Telephone Encounter (Signed)
Addended by: Aura Camps on: 05/25/2011 11:55 AM   Modules accepted: Orders

## 2011-05-25 NOTE — Telephone Encounter (Signed)
See previous encounter. Dr. Reece Agar. Closed it.  I advised patient per his instructions there. Letter faxed to employer,

## 2011-05-25 NOTE — Telephone Encounter (Signed)
RX SENT, PT INFORMED 

## 2011-05-27 ENCOUNTER — Encounter: Payer: Self-pay | Admitting: Obstetrics and Gynecology

## 2011-05-27 ENCOUNTER — Ambulatory Visit (INDEPENDENT_AMBULATORY_CARE_PROVIDER_SITE_OTHER): Payer: Managed Care, Other (non HMO) | Admitting: Obstetrics and Gynecology

## 2011-05-27 DIAGNOSIS — N809 Endometriosis, unspecified: Secondary | ICD-10-CM

## 2011-05-27 MED ORDER — FLUCONAZOLE 150 MG PO TABS: 150.0000 mg | ORAL_TABLET | Freq: Every day | ORAL | Status: AC

## 2011-05-27 MED ORDER — METRONIDAZOLE 0.75 % VA GEL: 1.0000 | Freq: Every day | VAGINAL | Status: AC

## 2011-05-27 MED ORDER — VALACYCLOVIR HCL 500 MG PO TABS: 500.0000 mg | ORAL_TABLET | Freq: Two times a day (BID) | ORAL | Status: AC

## 2011-05-27 NOTE — Progress Notes (Signed)
Patient came back to see me today for a postoperative visit. She has had complete relief of her pain since her surgery. She is complaining of a disagreeable vaginal discharge. She would also like a prescription for yeast because she is on antibiotic I gave her for sinusitis which is working well. 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  Exam: Melissa Sellers present. Abdomen is soft without masses or tenderness. 3 incisions are healing well. Pelvic exam: External within normal limits. BUS within normal limits. Vaginal exam within normal limits. There is some blood in the vagina making wet prep unobtainable. Cervix is clean without lesions. Uterus is normal size and shape. Adnexa failed to reveal masses.   Assessment: #1. Normal postoperative recovery #2. Probable bacterial vaginosis #3. HSV 1 and 2 by history.  Plan: We discussed findings of surgery in detail. I told the patient I was happy that her pain was gone. We discussed the pathology report which revealed multiple cysts but no endometriosis. Prescription for MetroGel vaginal cream, Diflucan, and Valtrex written. She'll return in 2 months for annual exam.

## 2011-05-28 ENCOUNTER — Telehealth: Payer: Self-pay | Admitting: Obstetrics and Gynecology

## 2011-05-28 ENCOUNTER — Encounter: Payer: Self-pay | Admitting: Obstetrics and Gynecology

## 2011-05-28 NOTE — Telephone Encounter (Signed)
Patient called stating she needs a note faxed to her employer saying she is okay to be back at work today.  Letter faxed and there is a copy of it in her e-chart and also her hard copy chart.

## 2011-07-02 ENCOUNTER — Encounter: Payer: Self-pay | Admitting: Obstetrics and Gynecology

## 2011-07-02 ENCOUNTER — Ambulatory Visit (INDEPENDENT_AMBULATORY_CARE_PROVIDER_SITE_OTHER): Payer: Managed Care, Other (non HMO) | Admitting: Obstetrics and Gynecology

## 2011-07-02 DIAGNOSIS — N949 Unspecified condition associated with female genital organs and menstrual cycle: Secondary | ICD-10-CM

## 2011-07-02 DIAGNOSIS — R102 Pelvic and perineal pain: Secondary | ICD-10-CM

## 2011-07-02 NOTE — Progress Notes (Signed)
Patient came to see me today complaining of widespread pelvic pain mostly in the midline and on the right ovary. One month ago we did a left salpingo-oophorectomy and there was no endometriosis elsewhere. 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 She saw Dr. Logan Bores yesterday and he put lidocaine and her bladder and her IC pain is better but not the pain on the right. She ovulated recently and she thinks is related to that. She is going to a very stressful divorce with problems at work which were outlined by Sherrilyn Rist in visit info. She is having no nausea and vomiting, diarrhea, fever. She has never done well with birth control pills because of mood swings. She had a lot of trouble with a Mirena IUD as well. She sees a psychiatrist who gives her Lexapro.  Exam: Amy present. Abdomen is soft without guarding rebound or masses. Pelvic exam: External: Within normal limits. BUS within normal limits. Vaginal exam: Within normal limits. Cervix: Clean. Uterus: Within normal limits. Adnexa: Within normal limits. Rectovaginal exam: Within normal limits.   Assessment: #1. Pelvic pain #2. Endometriosis #3. Interstitial cystitis  Plan: We discussed that I believe she does have endometriosis but certainly her other stress aggravates her pain. I have asked her to discuss with her psychiatrist switching to Cymbalta as I think that might help her pain more. We also discussed Depo-Lupron with a add back and she is willing to try it. We also discussed referral to [facility] but we'll see how she does with Depo-Lupron.

## 2011-07-23 ENCOUNTER — Encounter: Payer: Managed Care, Other (non HMO) | Admitting: Obstetrics and Gynecology

## 2011-08-05 ENCOUNTER — Telehealth: Payer: Self-pay | Admitting: *Deleted

## 2011-08-05 DIAGNOSIS — R5383 Other fatigue: Secondary | ICD-10-CM

## 2011-08-05 NOTE — Telephone Encounter (Signed)
(  PT IS AWARE YOUR OUT OF THE OFFICE) PT CALLED TO REQUEST LAB WORK TO CHECK HER HORMONE LEVELS WITH THYROID AS WELL. PT IS C/O FEELING DOWN AND NO ENERGY TO DO ANYTHING. PT HAD left salpingo-oophorectomy DONE 05/22/11, SHE STATES EVERY SINCE SURGERY "SHE FEELS OUT OF [facility]". PT HAS APPOINTMENT WITH PSYCHIATRIST THIS WEEK, PT DOESN'T WANT TO TRY THE CYMBALTA AS NOTED IN OV NOTE 07/02/11. PLEASE ADVISE

## 2011-08-10 NOTE — Telephone Encounter (Signed)
Check her FSH, TSH, and free T4. Also check CBC.

## 2011-08-11 NOTE — Telephone Encounter (Signed)
Lm for patient to call

## 2011-08-11 NOTE — Addendum Note (Signed)
Addended byValeda Malm L on: 08/11/2011 12:42 PM   Modules accepted: Orders

## 2011-08-11 NOTE — Telephone Encounter (Signed)
Patient informed. appt and orders in pc.

## 2011-08-12 ENCOUNTER — Other Ambulatory Visit: Payer: Managed Care, Other (non HMO)

## 2011-08-14 ENCOUNTER — Other Ambulatory Visit: Payer: Managed Care, Other (non HMO)

## 2011-08-19 ENCOUNTER — Encounter: Payer: Managed Care, Other (non HMO) | Admitting: Obstetrics and Gynecology

## 2012-04-06 ENCOUNTER — Other Ambulatory Visit: Payer: Self-pay | Admitting: Obstetrics and Gynecology

## 2012-05-13 ENCOUNTER — Ambulatory Visit: Payer: Self-pay | Admitting: Gynecology

## 2012-05-26 ENCOUNTER — Encounter: Payer: Self-pay | Admitting: Gynecology

## 2012-06-03 ENCOUNTER — Encounter: Payer: Self-pay | Admitting: Gynecology

## 2012-06-07 ENCOUNTER — Encounter: Payer: Self-pay | Admitting: Gynecology

## 2012-06-09 ENCOUNTER — Encounter: Payer: Self-pay | Admitting: Gynecology

## 2012-07-01 ENCOUNTER — Telehealth: Payer: Self-pay | Admitting: *Deleted

## 2012-07-01 NOTE — Telephone Encounter (Signed)
Telephone call, multiple complaints, bloated lower abdomen, left lower quadrant pain, history of IC on Macrodantin per Dr. Logan Bores, states it does not feel like IC pain.  States had a cycle with only 2 weeks between, usually has 3 weeks between cycles. States has spotting. New partner. Reviewed will do STD screen at office visit on 6/25. Denies N/V/D, constipation, or fever. Encouraged to rest, Motrin as needed for discomfort, ER if pain increases. States has had the discomfort for one week.

## 2012-07-01 NOTE — Telephone Encounter (Signed)
Dr.G old patient) pt has appointment with you on 07/06/12 she is c/o left side pain, which in Dr.G notes her thought could be endometriosis, pt also has Interstitial cystitis. LMP:06/16/12 2 weeks prior to this cycle she had another cycle that was dark blood then light blood. She asked where she could go to get help with this but I explained to pt that a MD must see you for referral and have the correct diagnosis. She takes OTC for IC and no relief for pain. She asked to speak with you about this, call back # [phone]

## 2012-07-06 ENCOUNTER — Encounter: Payer: Self-pay | Admitting: Women's Health

## 2012-07-06 ENCOUNTER — Ambulatory Visit (INDEPENDENT_AMBULATORY_CARE_PROVIDER_SITE_OTHER): Payer: Managed Care, Other (non HMO) | Admitting: Women's Health

## 2012-07-06 DIAGNOSIS — N809 Endometriosis, unspecified: Secondary | ICD-10-CM

## 2012-07-06 DIAGNOSIS — N803 Endometriosis of pelvic peritoneum: Secondary | ICD-10-CM

## 2012-07-06 DIAGNOSIS — N644 Mastodynia: Secondary | ICD-10-CM

## 2012-07-06 DIAGNOSIS — N923 Ovulation bleeding: Secondary | ICD-10-CM

## 2012-07-06 DIAGNOSIS — N3010 Interstitial cystitis (chronic) without hematuria: Secondary | ICD-10-CM | POA: Insufficient documentation

## 2012-07-06 DIAGNOSIS — N921 Excessive and frequent menstruation with irregular cycle: Secondary | ICD-10-CM

## 2012-07-06 DIAGNOSIS — R102 Pelvic and perineal pain: Secondary | ICD-10-CM

## 2012-07-06 LAB — URINALYSIS W MICROSCOPIC + REFLEX CULTURE
Crystals: NONE SEEN
Ketones, ur: 15 mg/dL — AB
Nitrite: POSITIVE — AB
Protein, ur: 100 mg/dL — AB
Specific Gravity, Urine: 1.01 (ref 1.005–1.030)
Urobilinogen, UA: 4 mg/dL — ABNORMAL HIGH (ref 0.0–1.0)

## 2012-07-06 LAB — WET PREP FOR TRICH, YEAST, CLUE
Trich, Wet Prep: NONE SEEN
Yeast Wet Prep HPF POC: NONE SEEN

## 2012-07-06 LAB — PREGNANCY, URINE: Preg Test, Ur: NEGATIVE

## 2012-07-06 MED ORDER — TRAMADOL HCL 50 MG PO TABS: 50.0000 mg | ORAL_TABLET | Freq: Four times a day (QID) | ORAL | Status: DC | PRN

## 2012-07-06 NOTE — Progress Notes (Signed)
Patient ID: Melissa Sellers, female   DOB: [birth year], 32 y.o.   MRN: [id number] Presents with numerous complaints involving chronic pelvic pain. States has had some breast tenderness, low abdominal bloating/pain mostly on the left side, urinary burning/pain. Has had some relief with AZO. History of IC, endometriosis. Right salpingectomy/ectopic 2010, 2013 LSO. Hydrocodone per Dr. Logan Bores for the IC. Normal cycle in May, 2 weeks of spotting after. New partner for past few months. Denies N/V/D or constipation. History of ovarian cysts.  Exam: Appears uncomfortable, no CVAT, abdomen is soft, no rebound or radiation of pain,  pain with deep palpation on left lower quadrant. UA abnormal from AZO, 0-2 WBCs few bacteria. Speculum exam scant discharge, wet prep negative. GC/Chlamydia culture. No CMT, tenderness  left adnexa no fullness noted. No tenderness or fullness to right adnexal.  Left lower quadrant pain IC Endometriosis/chronic pelvic pain  Plan: GC/Chlamydia culture pending. U PT negative. Urine culture pending. Will check HIV, hepatitis, RPR at annual exam.ultrasound after next cycle.

## 2012-07-07 LAB — GC/CHLAMYDIA PROBE AMP
CT Probe RNA: NEGATIVE
GC Probe RNA: NEGATIVE

## 2012-07-08 ENCOUNTER — Ambulatory Visit (INDEPENDENT_AMBULATORY_CARE_PROVIDER_SITE_OTHER): Payer: Managed Care, Other (non HMO)

## 2012-07-08 ENCOUNTER — Encounter: Payer: Self-pay | Admitting: Women's Health

## 2012-07-08 ENCOUNTER — Ambulatory Visit (INDEPENDENT_AMBULATORY_CARE_PROVIDER_SITE_OTHER): Payer: Managed Care, Other (non HMO) | Admitting: Women's Health

## 2012-07-08 DIAGNOSIS — R102 Pelvic and perineal pain: Secondary | ICD-10-CM

## 2012-07-08 DIAGNOSIS — N921 Excessive and frequent menstruation with irregular cycle: Secondary | ICD-10-CM

## 2012-07-08 DIAGNOSIS — IMO0001 Reserved for inherently not codable concepts without codable children: Secondary | ICD-10-CM

## 2012-07-08 DIAGNOSIS — N923 Ovulation bleeding: Secondary | ICD-10-CM

## 2012-07-08 DIAGNOSIS — Z309 Encounter for contraceptive management, unspecified: Secondary | ICD-10-CM

## 2012-07-08 LAB — URINE CULTURE: Colony Count: NO GROWTH

## 2012-07-08 MED ORDER — ETONOGESTREL-ETHINYL ESTRADIOL 0.12-0.015 MG/24HR VA RING: VAGINAL_RING | VAGINAL | Status: DC

## 2012-07-08 NOTE — Progress Notes (Signed)
Patient ID: Melissa Sellers, female   DOB: 1980/12/18, 32 y.o.   MRN: [id number] Presents for ultrasound. Was seen in the office with irregular cycles, low abdominal mostly left-sided  pelvic pain. History of endometriosis and IC/chronic pelvic pain. LSO, right fallopian tube removed.  Ultrasound: No uterine abnormalities noted. Right ovary appears normal with a 17 mm follicle. Left adnexa is normal with slight excessive bowel gas seen. No free fluid.  Endometriosis/IC-chronic pelvic pain Intestinal gas  Plan: Reviewed importance of increasing fiber rich foods and diet, increasing regular exercise, magnesium supplement daily encouraged. Contraception/cycle control reviewed, will try NuvaRing, prescription, samples, proper use given and reviewed slight risk for blood clots and strokes. Has used in the past without a problem. Overdue for annual, instructed to schedule and will evaluate at that time.

## 2012-08-18 ENCOUNTER — Encounter: Payer: Managed Care, Other (non HMO) | Admitting: Women's Health

## 2013-08-12 ENCOUNTER — Emergency Department (HOSPITAL_COMMUNITY): Payer: Managed Care, Other (non HMO)

## 2013-08-12 ENCOUNTER — Emergency Department (HOSPITAL_COMMUNITY): Payer: Self-pay

## 2013-08-12 ENCOUNTER — Encounter (HOSPITAL_COMMUNITY): Payer: Self-pay | Admitting: Emergency Medicine

## 2013-08-12 ENCOUNTER — Emergency Department (HOSPITAL_COMMUNITY)
Admission: EM | Admit: 2013-08-12 | Discharge: 2013-08-12 | Disposition: A | Discharge status: Home / Self Care | Payer: Self-pay | Attending: Emergency Medicine | Admitting: Emergency Medicine

## 2013-08-12 DIAGNOSIS — Z3202 Encounter for pregnancy test, result negative: Secondary | ICD-10-CM | POA: Insufficient documentation

## 2013-08-12 DIAGNOSIS — Z9079 Acquired absence of other genital organ(s): Secondary | ICD-10-CM | POA: Insufficient documentation

## 2013-08-12 DIAGNOSIS — Z79899 Other long term (current) drug therapy: Secondary | ICD-10-CM | POA: Insufficient documentation

## 2013-08-12 DIAGNOSIS — G43909 Migraine, unspecified, not intractable, without status migrainosus: Secondary | ICD-10-CM | POA: Insufficient documentation

## 2013-08-12 DIAGNOSIS — R112 Nausea with vomiting, unspecified: Secondary | ICD-10-CM | POA: Insufficient documentation

## 2013-08-12 DIAGNOSIS — R102 Pelvic and perineal pain: Secondary | ICD-10-CM

## 2013-08-12 DIAGNOSIS — Z91040 Latex allergy status: Secondary | ICD-10-CM | POA: Insufficient documentation

## 2013-08-12 DIAGNOSIS — K5900 Constipation, unspecified: Secondary | ICD-10-CM | POA: Insufficient documentation

## 2013-08-12 DIAGNOSIS — Z87891 Personal history of nicotine dependence: Secondary | ICD-10-CM | POA: Insufficient documentation

## 2013-08-12 DIAGNOSIS — N949 Unspecified condition associated with female genital organs and menstrual cycle: Secondary | ICD-10-CM | POA: Insufficient documentation

## 2013-08-12 DIAGNOSIS — Z880 Allergy status to penicillin: Secondary | ICD-10-CM | POA: Insufficient documentation

## 2013-08-12 DIAGNOSIS — Z8619 Personal history of other infectious and parasitic diseases: Secondary | ICD-10-CM | POA: Insufficient documentation

## 2013-08-12 DIAGNOSIS — Z791 Long term (current) use of non-steroidal anti-inflammatories (NSAID): Secondary | ICD-10-CM | POA: Insufficient documentation

## 2013-08-12 DIAGNOSIS — R109 Unspecified abdominal pain: Secondary | ICD-10-CM | POA: Insufficient documentation

## 2013-08-12 LAB — URINALYSIS, ROUTINE W REFLEX MICROSCOPIC
BILIRUBIN URINE: NEGATIVE
Glucose, UA: NEGATIVE mg/dL
KETONES UR: NEGATIVE mg/dL
Leukocytes, UA: NEGATIVE
NITRITE: NEGATIVE
PROTEIN: NEGATIVE mg/dL
Specific Gravity, Urine: 1.008 (ref 1.005–1.030)
UROBILINOGEN UA: 0.2 mg/dL (ref 0.0–1.0)
pH: 7.5 (ref 5.0–8.0)

## 2013-08-12 LAB — COMPREHENSIVE METABOLIC PANEL
ALBUMIN: 4.2 g/dL (ref 3.5–5.2)
ALK PHOS: 49 U/L (ref 39–117)
ALT: 29 U/L (ref 0–35)
ANION GAP: 11 (ref 5–15)
AST: 27 U/L (ref 0–37)
BUN: 17 mg/dL (ref 6–23)
CALCIUM: 8.9 mg/dL (ref 8.4–10.5)
CO2: 25 mEq/L (ref 19–32)
CREATININE: 0.59 mg/dL (ref 0.50–1.10)
Chloride: 101 mEq/L (ref 96–112)
GFR calc Af Amer: >90 mL/min (ref >90)
GFR calc non Af Amer: >90 mL/min (ref >90)
Glucose, Bld: 85 mg/dL (ref 70–99)
POTASSIUM: 4.2 meq/L (ref 3.7–5.3)
Sodium: 137 mEq/L (ref 137–147)
TOTAL PROTEIN: 7.6 g/dL (ref 6.0–8.3)
Total Bilirubin: <0.2 mg/dL — ABNORMAL LOW (ref 0.3–1.2)

## 2013-08-12 LAB — WET PREP, GENITAL
Clue Cells Wet Prep HPF POC: NONE SEEN
Trich, Wet Prep: NONE SEEN
Yeast Wet Prep HPF POC: NONE SEEN

## 2013-08-12 LAB — CBC WITH DIFFERENTIAL/PLATELET
BASOS PCT: 1 % (ref 0–1)
Basophils Absolute: 0.1 10*3/uL (ref 0.0–0.1)
EOS PCT: 3 % (ref 0–5)
Eosinophils Absolute: 0.2 10*3/uL (ref 0.0–0.7)
HCT: 41 % (ref 36.0–46.0)
HEMOGLOBIN: 14.4 g/dL (ref 12.0–15.0)
Lymphocytes Relative: 31 % (ref 12–46)
Lymphs Abs: 1.8 10*3/uL (ref 0.7–4.0)
MCH: 32.1 pg (ref 26.0–34.0)
MCHC: 35.1 g/dL (ref 30.0–36.0)
MCV: 91.5 fL (ref 78.0–100.0)
Monocytes Absolute: 0.4 10*3/uL (ref 0.1–1.0)
Monocytes Relative: 7 % (ref 3–12)
NEUTROS PCT: 58 % (ref 43–77)
Neutro Abs: 3.5 10*3/uL (ref 1.7–7.7)
PLATELETS: 199 10*3/uL (ref 150–400)
RBC: 4.48 MIL/uL (ref 3.87–5.11)
RDW: 12 % (ref 11.5–15.5)
WBC: 5.9 10*3/uL (ref 4.0–10.5)

## 2013-08-12 LAB — URINE MICROSCOPIC-ADD ON

## 2013-08-12 LAB — LIPASE, BLOOD: LIPASE: 29 U/L (ref 11–59)

## 2013-08-12 LAB — PREGNANCY, URINE: PREG TEST UR: NEGATIVE

## 2013-08-12 MED ORDER — OXYCODONE-ACETAMINOPHEN 5-325 MG PO TABS
2.0000 | ORAL_TABLET | Freq: Once | ORAL | Status: AC
Start: 2013-08-12 — End: 2013-08-12
  Administered 2013-08-12: 2 via ORAL
  Filled 2013-08-12: qty 2

## 2013-08-12 MED ORDER — IOHEXOL 300 MG/ML  SOLN
100.0000 mL | Freq: Once | INTRAMUSCULAR | Status: AC | PRN
  Administered 2013-08-12: 100 mL via INTRAVENOUS

## 2013-08-12 MED ORDER — IBUPROFEN 800 MG PO TABS: 800.0000 mg | ORAL_TABLET | Freq: Three times a day (TID) | ORAL | Status: DC

## 2013-08-12 MED ORDER — OXYCODONE-ACETAMINOPHEN 5-325 MG PO TABS: 2.0000 | ORAL_TABLET | ORAL | Status: DC | PRN

## 2013-08-12 MED ORDER — IOHEXOL 300 MG/ML  SOLN
25.0000 mL | Freq: Once | INTRAMUSCULAR | Status: AC | PRN
  Administered 2013-08-12: 25 mL via ORAL

## 2013-08-12 MED ORDER — HYDROCODONE-ACETAMINOPHEN 5-325 MG PO TABS: 2.0000 | ORAL_TABLET | ORAL | Status: DC | PRN

## 2013-08-12 MED ORDER — MORPHINE SULFATE 4 MG/ML IJ SOLN
4.0000 mg | Freq: Once | INTRAMUSCULAR | Status: AC
  Administered 2013-08-12: 4 mg via INTRAVENOUS
  Filled 2013-08-12: qty 1

## 2013-08-12 MED ORDER — SODIUM CHLORIDE 0.9 % IV BOLUS (SEPSIS)
1000.0000 mL | Freq: Once | INTRAVENOUS | Status: AC
  Administered 2013-08-12: 1000 mL via INTRAVENOUS

## 2013-08-12 MED ORDER — ONDANSETRON HCL 4 MG/2ML IJ SOLN
4.0000 mg | Freq: Once | INTRAMUSCULAR | Status: AC
  Administered 2013-08-12: 4 mg via INTRAVENOUS
  Filled 2013-08-12: qty 2

## 2013-08-12 MED ORDER — ONDANSETRON HCL 4 MG PO TABS: 4.0000 mg | ORAL_TABLET | Freq: Four times a day (QID) | ORAL | Status: DC

## 2013-08-12 NOTE — ED Notes (Signed)
Pt presents to department for evaluation of lower abdominal pain, and N/V x3 days. History of ovarian cysts and endometriosis. 10/10 pain upon arrival to ED. Pt is alert and oriented x4.

## 2013-08-12 NOTE — ED Provider Notes (Signed)
CSN: [id number]     Arrival date & time 08/12/13  1813 History   First MD Initiated Contact with Patient 08/12/13 1833     Chief Complaint  Patient presents with  . Abdominal Pain     (Consider location/radiation/quality/duration/timing/severity/associated sxs/prior Treatment) HPI Comments: Patient presents with a one-day history of lower abdominal pain with nausea and vomiting that is consistent with previous episodes of endometriosis. States she's had one ovary removed several surgeries for endometriosis. She denies any fever. She endorses chronic dysuria from interstitial cystitis and takes chronic Macrobid. She denies any her stool. She endorses lower abdominal pain. Nothing makes the pain better or worse. It is not improved hydrocodone. Denies any dizziness or lightheadedness. She denies any chest pain or shortness of breath. She denies any focal weakness, numbness or tingling. She is not on any kind of birth control. The pain got worse after she started her period yesterday.  The history is provided by the patient.    Past Medical History  Diagnosis Date  . Migraines   . Interstitial cystitis   . Endometriosis   . Pelvic pain in female   . Frequency of urination   . Urgency of urination   . Nocturia   . Bladder spasm   . Herpes simplex    Past Surgical History  Procedure Laterality Date  . Ectopic pregnancy surgery  2010    right salpingectomy  . Augmentation mammaplasty  2009    IMPLANTS  . Multiple laparoscopies for endometriosis  LAST ONE 2010  . Cysto/ hod/ instillation therapy  AUG 2011  . Oophorectomy  2013    LSO  . Pelvic laparoscopy      DL  LSO   Family History  Problem Relation Age of Onset  . Breast cancer Paternal Grandmother    History  Substance Use Topics  . Smoking status: Former Smoker -- 0.15 packs/day for 8 years    Types: Cigarettes  . Smokeless tobacco: Never Used     Comment: STATES HAS SMOKED A TOTAL OF 8 YRS--  OFF AND ON   . Alcohol  Use: No   OB History   Grav Para Term Preterm Abortions TAB SAB Ect Mult Living   4 1 1  3  2 1  0 1     Review of Systems  Constitutional: Positive for activity change, appetite change and fatigue. Negative for fever.  HENT: Negative for congestion.   Respiratory: Negative for cough, chest tightness and shortness of breath.   Cardiovascular: Negative for chest pain.  Gastrointestinal: Positive for nausea, vomiting and abdominal pain.  Genitourinary: Positive for dysuria, hematuria, vaginal bleeding and pelvic pain. Negative for vaginal discharge.  Musculoskeletal: Positive for arthralgias, back pain and myalgias. Negative for neck pain.  Skin: Negative for rash.  Neurological: Negative for dizziness, weakness, light-headedness and headaches.  A complete 10 system review of systems was obtained and all systems are negative except as noted in the HPI and PMH.      Allergies  Amitriptyline; Ciprofloxacin; Doxycycline; Penicillins; Shellfish allergy; Sulfa antibiotics; Tramadol; Flagyl; and Latex  Home Medications   Prior to Admission medications   Medication Sig Start Date End Date Taking? Authorizing Provider  ALPRAZolam Prudy Feeler) 1 MG tablet Take 1 mg by mouth at bedtime as needed for anxiety.    Yes Historical Provider, MD  amphetamine-dextroamphetamine (ADDERALL) 5 MG tablet Take 5 mg by mouth 3 (three) times daily.   Yes Historical Provider, MD  aspirin-acetaminophen-caffeine (EXCEDRIN MIGRAINE) [phone] MG per  tablet Take 2 tablets by mouth every 6 (six) hours as needed for headache.   Yes Historical Provider, MD  citalopram (CELEXA) 40 MG tablet Take 80 mg by mouth at bedtime.   Yes Historical Provider, MD  Lactobacillus (ACIDOPHILUS PO) Take 1 capsule by mouth daily.   Yes Historical Provider, MD  nitrofurantoin, macrocrystal-monohydrate, (MACROBID) 100 MG capsule Take 100 mg by mouth at bedtime.   Yes Historical Provider, MD  HYDROcodone-acetaminophen (NORCO/VICODIN) 5-325 MG  per tablet Take 2 tablets by mouth every 4 (four) hours as needed. 08/12/13   Glynn Octave, MD  ibuprofen (ADVIL,MOTRIN) 800 MG tablet Take 1 tablet (800 mg total) by mouth 3 (three) times daily. 08/12/13   Glynn Octave, MD  ondansetron (ZOFRAN) 4 MG tablet Take 1 tablet (4 mg total) by mouth every 6 (six) hours. 08/12/13   Glynn Octave, MD  oxyCODONE-acetaminophen (PERCOCET/ROXICET) 5-325 MG per tablet Take 2 tablets by mouth every 4 (four) hours as needed for severe pain. 08/12/13   Glynn Octave, MD   BP 117/85  Pulse 75  Temp(Src) 98 F (36.7 C) (Oral)  Resp 18  SpO2 99%  LMP 07/12/2013 Physical Exam  Nursing note and vitals reviewed. Constitutional: She is oriented to person, place, and time. She appears well-developed and well-nourished. No distress.  Flat affect  HENT:  Head: Normocephalic and atraumatic.  Mouth/Throat: Oropharynx is clear and moist. No oropharyngeal exudate.  Eyes: Conjunctivae and EOM are normal. Pupils are equal, round, and reactive to light.  Neck: Normal range of motion. Neck supple.  No meningismus.  Cardiovascular: Normal rate, regular rhythm, normal heart sounds and intact distal pulses.   No murmur heard. Pulmonary/Chest: Effort normal and breath sounds normal. No respiratory distress.  Abdominal: Soft. There is tenderness. There is no rebound and no guarding.  Diffuse lower tenderness, worse suprapubic. Tender in the right lower quadrant left lower quadrant. No upper abdominal tenderness  Genitourinary:  Normal external genitalia. Dark blood in the vaginal vault. No CMT. Right-sided adnexal tenderness. No left adnexal tenderness. Chaperone present  Musculoskeletal: Normal range of motion. She exhibits no edema and no tenderness.  No CVA tenderness  Neurological: She is alert and oriented to person, place, and time. No cranial nerve deficit. She exhibits normal muscle tone. Coordination normal.  No ataxia on finger to nose bilaterally. No pronator  drift. 5/5 strength throughout. CN 2-12 intact. Negative Romberg. Equal grip strength. Sensation intact. Gait is normal.   Skin: Skin is warm.  Psychiatric: She has a normal mood and affect. Her behavior is normal.    ED Course  Procedures (including critical care time) Labs Review Labs Reviewed  WET PREP, GENITAL - Abnormal; Notable for the following:    WBC, Wet Prep HPF POC FEW (*)    All other components within normal limits  URINALYSIS, ROUTINE W REFLEX MICROSCOPIC - Abnormal; Notable for the following:    Hgb urine dipstick SMALL (*)    All other components within normal limits  COMPREHENSIVE METABOLIC PANEL - Abnormal; Notable for the following:    Total Bilirubin <0.2 (*)    All other components within normal limits  GC/CHLAMYDIA PROBE AMP  PREGNANCY, URINE  CBC WITH DIFFERENTIAL  LIPASE, BLOOD  URINE MICROSCOPIC-ADD ON    Imaging Review US Transvaginal Non-ob  08/12/2013   CLINICAL DATA:  Pelvic pain. Previous removal of one of the ovaries and fallopian tubes. The patient does not know which side.  EXAM: TRANSABDOMINAL AND TRANSVAGINAL ULTRASOUND OF PELVIS  DOPPLER ULTRASOUND  OF OVARIES  TECHNIQUE: Both transabdominal and transvaginal ultrasound examinations of the pelvis were performed. Transabdominal technique was performed for global imaging of the pelvis including uterus, ovaries, adnexal regions, and pelvic cul-de-sac.  It was necessary to proceed with endovaginal exam following the transabdominal exam to visualize the uterus and right ovary in better detail. Color and duplex Doppler ultrasound was utilized to evaluate blood flow to the ovaries.  COMPARISON:  05/06/2004.  FINDINGS: Uterus  Measurements: 7.6 x 5.2 x 3.6 cm. No fibroids or other mass visualized. A probable C-section scar is noted.  Endometrium  Thickness: 5.4 mm.  No focal abnormality visualized.  Right ovary  Measurements: 3.1 x 2.5 x 1.7 cm. Normal appearance/no adnexal mass.  Left ovary  Surgically absent.   Pulsed Doppler evaluation of the right ovary demonstrates normal low-resistance arterial and venous waveforms.  Other findings  No free fluid.  IMPRESSION: 1. No acute abnormality. 2. Status post left oophorectomy.   Electronically Signed   By: Gordan PaymentSteve  Reid M.D.   On: 08/12/2013 20:35   [place] Pelvis Complete  08/12/2013   CLINICAL DATA:  Pelvic pain. Previous removal of one of the ovaries and fallopian tubes. The patient does not know which side.  EXAM: TRANSABDOMINAL AND TRANSVAGINAL ULTRASOUND OF PELVIS  DOPPLER ULTRASOUND OF OVARIES  TECHNIQUE: Both transabdominal and transvaginal ultrasound examinations of the pelvis were performed. Transabdominal technique was performed for global imaging of the pelvis including uterus, ovaries, adnexal regions, and pelvic cul-de-sac.  It was necessary to proceed with endovaginal exam following the transabdominal exam to visualize the uterus and right ovary in better detail. Color and duplex Doppler ultrasound was utilized to evaluate blood flow to the ovaries.  COMPARISON:  05/06/2004.  FINDINGS: Uterus  Measurements: 7.6 x 5.2 x 3.6 cm. No fibroids or other mass visualized. A probable C-section scar is noted.  Endometrium  Thickness: 5.4 mm.  No focal abnormality visualized.  Right ovary  Measurements: 3.1 x 2.5 x 1.7 cm. Normal appearance/no adnexal mass.  Left ovary  Surgically absent.  Pulsed Doppler evaluation of the right ovary demonstrates normal low-resistance arterial and venous waveforms.  Other findings  No free fluid.  IMPRESSION: 1. No acute abnormality. 2. Status post left oophorectomy.   Electronically Signed   By: Gordan PaymentSteve  Reid M.D.   On: 08/12/2013 20:35   Ct Abdomen Pelvis W Contrast  08/12/2013   CLINICAL DATA:  Crampy lower abdominal pain. Nausea and vomiting. Diarrhea. Current history of endometriosis. Surgical history includes left oophorectomy and ectopic pregnancy.  EXAM: CT ABDOMEN AND PELVIS WITH CONTRAST  TECHNIQUE: Multidetector CT imaging of the  abdomen and pelvis was performed using the standard protocol following bolus administration of intravenous contrast.  CONTRAST:  100mL OMNIPAQUE IOHEXOL 300 MG/ML IV. Oral contrast was also administered.  COMPARISON:  No prior CT.  Pelvic ultrasound earlier same date.  FINDINGS: Normal-appearing liver, spleen, pancreas, adrenal glands, kidneys, and gallbladder. No biliary ductal dilation. No visible aortoiliofemoral atherosclerosis. Patent visceral arteries. No significant lymphadenopathy.  Normal-appearing stomach and small bowel. Large stool burden throughout the normal appearing colon. Normal appendix in the right upper pelvis. No ascites.  Normal-appearing uterus demonstrating the expected heterogeneous early enhancement pattern. Normal appearing right ovary. Left ovary surgically absent. No adnexal masses or free pelvic fluid. Urinary bladder unremarkable. Phleboliths low in both sides of the pelvis.  Bone window images unremarkable. Visualized lung bases clear. Heart size normal. Bilateral breast implants.  IMPRESSION: 1. No acute abnormalities involving the abdomen or pelvis.  2. Large stool burden.   Electronically Signed   By: Hulan Saas M.D.   On: 08/12/2013 22:43   [place] Art/ven Flow Abd Pelv Doppler  08/12/2013   CLINICAL DATA:  Pelvic pain. Previous removal of one of the ovaries and fallopian tubes. The patient does not know which side.  EXAM: TRANSABDOMINAL AND TRANSVAGINAL ULTRASOUND OF PELVIS  DOPPLER ULTRASOUND OF OVARIES  TECHNIQUE: Both transabdominal and transvaginal ultrasound examinations of the pelvis were performed. Transabdominal technique was performed for global imaging of the pelvis including uterus, ovaries, adnexal regions, and pelvic cul-de-sac.  It was necessary to proceed with endovaginal exam following the transabdominal exam to visualize the uterus and right ovary in better detail. Color and duplex Doppler ultrasound was utilized to evaluate blood flow to the ovaries.   COMPARISON:  05/06/2004.  FINDINGS: Uterus  Measurements: 7.6 x 5.2 x 3.6 cm. No fibroids or other mass visualized. A probable C-section scar is noted.  Endometrium  Thickness: 5.4 mm.  No focal abnormality visualized.  Right ovary  Measurements: 3.1 x 2.5 x 1.7 cm. Normal appearance/no adnexal mass.  Left ovary  Surgically absent.  Pulsed Doppler evaluation of the right ovary demonstrates normal low-resistance arterial and venous waveforms.  Other findings  No free fluid.  IMPRESSION: 1. No acute abnormality. 2. Status post left oophorectomy.   Electronically Signed   By: Gordan Payment M.D.   On: 08/12/2013 20:35     EKG Interpretation None      MDM   Final diagnoses:  Pelvic pain in female  Constipation, unspecified constipation type   lower abdominal pain with history of endometriosis and interstitial cystitis. Associated with nausea and vomiting. Abdomen soft without peritoneal signs.  UA negative. Pregnancy test negative. Ultrasound shows normal blood flow to the right ovary. No evidence of torsion. Large stool burden. No evidence of appendicitis.  Symptoms well controlled in the ED. No vomiting. Followup with her gynecologist. Continue anti-inflammatories and pain medication. Patient states she has MiraLAX at home. Constipation likely contributing to symptoms.  Glynn Octave, MD 08/13/13 [phone]

## 2013-08-12 NOTE — Discharge Instructions (Signed)
Pelvic Pain Take the miralax as discussed. Follow up with your GYN.  Return to the ED if you develop new or worsening symptoms. Female pelvic pain can be caused by many different things and start from a variety of places. Pelvic pain refers to pain that is located in the lower half of the abdomen and between your hips. The pain may occur over a short period of time (acute) or may be reoccurring (chronic). The cause of pelvic pain may be related to disorders affecting the female reproductive organs (gynecologic), but it may also be related to the bladder, kidney stones, an intestinal complication, or muscle or skeletal problems. Getting help right away for pelvic pain is important, especially if there has been severe, sharp, or a sudden onset of unusual pain. It is also important to get help right away because some types of pelvic pain can be life threatening.  CAUSES  Below are only some of the causes of pelvic pain. The causes of pelvic pain can be in one of several categories.   Gynecologic.  Pelvic inflammatory disease.  Sexually transmitted infection.  Ovarian cyst or a twisted ovarian ligament (ovarian torsion).  Uterine lining that grows outside the uterus (endometriosis).  Fibroids, cysts, or tumors.  Ovulation.  Pregnancy.  Pregnancy that occurs outside the uterus (ectopic pregnancy).  Miscarriage.  Labor.  Abruption of the placenta or ruptured uterus.  Infection.  Uterine infection (endometritis).  Bladder infection.  Diverticulitis.  Miscarriage related to a uterine infection (septic abortion).  Bladder.  Inflammation of the bladder (cystitis).  Kidney stone(s).  Gastrointestinal.  Constipation.  Diverticulitis.  Neurologic.  Trauma.  Feeling pelvic pain because of mental or emotional causes (psychosomatic).  Cancers of the bowel or pelvis. EVALUATION  Your caregiver will want to take a careful history of your concerns. This includes recent  changes in your health, a careful gynecologic history of your periods (menses), and a sexual history. Obtaining your family history and medical history is also important. Your caregiver may suggest a pelvic exam. A pelvic exam will help identify the location and severity of the pain. It also helps in the evaluation of which organ system may be involved. In order to identify the cause of the pelvic pain and be properly treated, your caregiver may order tests. These tests may include:   A pregnancy test.  Pelvic ultrasonography.  An X-ray exam of the abdomen.  A urinalysis or evaluation of vaginal discharge.  Blood tests. HOME CARE INSTRUCTIONS   Only take over-the-counter or prescription medicines for pain, discomfort, or fever as directed by your caregiver.   Rest as directed by your caregiver.   Eat a balanced diet.   Drink enough fluids to make your urine clear or pale yellow, or as directed.   Avoid sexual intercourse if it causes pain.   Apply warm or cold compresses to the lower abdomen depending on which one helps the pain.   Avoid stressful situations.   Keep a journal of your pelvic pain. Write down when it started, where the pain is located, and if there are things that seem to be associated with the pain, such as food or your menstrual cycle.  Follow up with your caregiver as directed.  SEEK MEDICAL CARE IF:  Your medicine does not help your pain.  You have abnormal vaginal discharge. SEEK IMMEDIATE MEDICAL CARE IF:   You have heavy bleeding from the vagina.   Your pelvic pain increases.   You feel light-headed or faint.  You have chills.   You have pain with urination or blood in your urine.   You have uncontrolled diarrhea or vomiting.   You have a fever or persistent symptoms for more than 3 days.  You have a fever and your symptoms suddenly get worse.   You are being physically or sexually abused.  MAKE SURE YOU:  Understand these  instructions.  Will watch your condition.  Will get help if you are not doing well or get worse. Document Released: 11/26/2003 Document Revised: 05/15/2013 Document Reviewed: 04/20/2011 [facility] Patient Information 2015 [place], [place]. This information is not intended to replace advice given to you by your health care provider. Make sure you discuss any questions you have with your health care provider.

## 2013-08-12 NOTE — ED Notes (Signed)
Pt to US at this time.

## 2013-08-12 NOTE — ED Notes (Signed)
Pt given drink 

## 2013-08-12 NOTE — ED Notes (Signed)
Pt to CT

## 2013-08-14 LAB — GC/CHLAMYDIA PROBE AMP
CT Probe RNA: NEGATIVE
GC PROBE AMP APTIMA: NEGATIVE

## 2013-08-16 ENCOUNTER — Emergency Department (HOSPITAL_COMMUNITY)
Admission: EM | Admit: 2013-08-16 | Discharge: 2013-08-16 | Disposition: A | Discharge status: Home / Self Care | Payer: Managed Care, Other (non HMO) | Attending: Emergency Medicine | Admitting: Emergency Medicine

## 2013-08-16 ENCOUNTER — Encounter (HOSPITAL_COMMUNITY): Payer: Self-pay | Admitting: Emergency Medicine

## 2013-08-16 DIAGNOSIS — T50905A Adverse effect of unspecified drugs, medicaments and biological substances, initial encounter: Secondary | ICD-10-CM

## 2013-08-16 DIAGNOSIS — Z880 Allergy status to penicillin: Secondary | ICD-10-CM | POA: Insufficient documentation

## 2013-08-16 DIAGNOSIS — Z8742 Personal history of other diseases of the female genital tract: Secondary | ICD-10-CM | POA: Insufficient documentation

## 2013-08-16 DIAGNOSIS — Z79899 Other long term (current) drug therapy: Secondary | ICD-10-CM | POA: Insufficient documentation

## 2013-08-16 DIAGNOSIS — Z7982 Long term (current) use of aspirin: Secondary | ICD-10-CM | POA: Insufficient documentation

## 2013-08-16 DIAGNOSIS — Z8679 Personal history of other diseases of the circulatory system: Secondary | ICD-10-CM | POA: Insufficient documentation

## 2013-08-16 DIAGNOSIS — Z91040 Latex allergy status: Secondary | ICD-10-CM | POA: Insufficient documentation

## 2013-08-16 DIAGNOSIS — Z8619 Personal history of other infectious and parasitic diseases: Secondary | ICD-10-CM | POA: Insufficient documentation

## 2013-08-16 DIAGNOSIS — T450X5A Adverse effect of antiallergic and antiemetic drugs, initial encounter: Secondary | ICD-10-CM | POA: Insufficient documentation

## 2013-08-16 DIAGNOSIS — R109 Unspecified abdominal pain: Secondary | ICD-10-CM | POA: Insufficient documentation

## 2013-08-16 DIAGNOSIS — Z87891 Personal history of nicotine dependence: Secondary | ICD-10-CM | POA: Insufficient documentation

## 2013-08-16 LAB — CBG MONITORING, ED: Glucose-Capillary: 92 mg/dL (ref 70–99)

## 2013-08-16 MED ORDER — OXYCODONE-ACETAMINOPHEN 5-325 MG PO TABS
1.0000 | ORAL_TABLET | Freq: Once | ORAL | Status: AC
  Administered 2013-08-16: 1 via ORAL
  Filled 2013-08-16: qty 1

## 2013-08-16 MED ORDER — PROMETHAZINE HCL 25 MG PO TABS: 25.0000 mg | ORAL_TABLET | Freq: Three times a day (TID) | ORAL | Status: DC | PRN

## 2013-08-16 MED ORDER — OXYCODONE-ACETAMINOPHEN 5-325 MG PO TABS: 1.0000 | ORAL_TABLET | Freq: Four times a day (QID) | ORAL | Status: DC | PRN

## 2013-08-16 MED ORDER — KETOROLAC TROMETHAMINE 60 MG/2ML IM SOLN
60.0000 mg | Freq: Once | INTRAMUSCULAR | Status: AC
  Administered 2013-08-16: 60 mg via INTRAMUSCULAR
  Filled 2013-08-16: qty 2

## 2013-08-16 NOTE — ED Notes (Signed)
ED PA at bedside

## 2013-08-16 NOTE — Discharge Instructions (Signed)
Return here as needed.  Followup with your Dr. for recheck.  Increase your fluid intake °

## 2013-08-16 NOTE — ED Provider Notes (Signed)
Medical screening examination/treatment/procedure(s) were performed by non-physician practitioner and as supervising physician I was immediately available for consultation/collaboration.   EKG Interpretation None        David H Yao, MD 08/16/13 2209 

## 2013-08-16 NOTE — ED Provider Notes (Signed)
CSN: [id number]     Arrival date & time 08/16/13  1403 History   First MD Initiated Contact with Patient 08/16/13 1801     Chief Complaint  Patient presents with  . Allergic Reaction  . Abdominal Pain     (Consider location/radiation/quality/duration/timing/severity/associated sxs/prior Treatment) HPI Patient presents to the emergency department with concerns about a medication reaction.  The patient, states she feels, that she's had a right action to Zofran and her Celexa.  She states that she feels like she has something twisting around her spine tickling her spine.  The patient, states, that she doesn't have any other symptoms.  She continues with her pain in her lower abdomen.  Patient, states, that she normally takes hydrocodone without difficulty or pain, that this pain has been breaking through the patient, states she's not had any chest pain, shortness of breath, weakness, numbness, blurred vision, headache, back pain, neck pain, fever, cough, runny nose, sore throat, hematemesis, hematuria, rash, or syncope.  Patient, states she's had some dizziness, as well Past Medical History  Diagnosis Date  . Migraines   . Interstitial cystitis   . Endometriosis   . Pelvic pain in female   . Frequency of urination   . Urgency of urination   . Nocturia   . Bladder spasm   . Herpes simplex    Past Surgical History  Procedure Laterality Date  . Ectopic pregnancy surgery  2010    right salpingectomy  . Augmentation mammaplasty  2009    IMPLANTS  . Multiple laparoscopies for endometriosis  LAST ONE 2010  . Cysto/ hod/ instillation therapy  AUG 2011  . Oophorectomy  2013    LSO  . Pelvic laparoscopy      DL  LSO   Family History  Problem Relation Age of Onset  . Breast cancer Paternal Grandmother    History  Substance Use Topics  . Smoking status: Former Smoker -- 0.15 packs/day for 8 years    Types: Cigarettes  . Smokeless tobacco: Never Used     Comment: STATES HAS SMOKED A  TOTAL OF 8 YRS--  OFF AND ON   . Alcohol Use: No   OB History   Grav Para Term Preterm Abortions TAB SAB Ect Mult Living   4 1 1  3  2 1  0 1     Review of Systems  All other systems negative except as documented in the HPI. All pertinent positives and negatives as reviewed in the HPI.  Allergies  Amitriptyline; Ciprofloxacin; Doxycycline; Penicillins; Shellfish allergy; Sulfa antibiotics; Tramadol; Flagyl; and Latex  Home Medications   Prior to Admission medications   Medication Sig Start Date End Date Taking? Authorizing Provider  ALPRAZolam Prudy Feeler) 1 MG tablet Take 0.5-1 mg by mouth 2 (two) times daily as needed for anxiety.    Yes Historical Provider, MD  aspirin-acetaminophen-caffeine (EXCEDRIN MIGRAINE) [phone] MG per tablet Take 2 tablets by mouth every 6 (six) hours as needed for headache.   Yes Historical Provider, MD  citalopram (CELEXA) 40 MG tablet Take 80 mg by mouth at bedtime.   Yes Historical Provider, MD  HYDROcodone-acetaminophen (NORCO/VICODIN) 5-325 MG per tablet Take 1-2 tablets by mouth every 4 (four) hours as needed for moderate pain.   Yes Historical Provider, MD  Lactobacillus (ACIDOPHILUS PO) Take 1 capsule by mouth daily.   Yes Historical Provider, MD  nitrofurantoin, macrocrystal-monohydrate, (MACROBID) 100 MG capsule Take 100 mg by mouth at bedtime.   Yes Historical Provider, MD  ondansetron (  ZOFRAN) 4 MG tablet Take 4 mg by mouth every 6 (six) hours as needed for nausea or vomiting.   Yes Historical Provider, MD  oxyCODONE-acetaminophen (PERCOCET/ROXICET) 5-325 MG per tablet Take 2 tablets by mouth every 4 (four) hours as needed for severe pain. 08/12/13  Yes Glynn OctaveStephen Rancour, MD  phenazopyridine (PYRIDIUM) 95 MG tablet Take 95-190 mg by mouth 3 (three) times daily as needed for pain.   Yes Historical Provider, MD   BP 126/72  Pulse 75  Temp(Src) 98 F (36.7 C) (Oral)  Resp 25  SpO2 100%  LMP 08/12/2013 Physical Exam  Nursing note and vitals  reviewed. Constitutional: She is oriented to person, place, and time. She appears well-developed and well-nourished. No distress.  HENT:  Head: Normocephalic and atraumatic.  Mouth/Throat: Oropharynx is clear and moist.  Eyes: Pupils are equal, round, and reactive to light.  Neck: Normal range of motion. Neck supple.  Cardiovascular: Normal rate, regular rhythm and normal heart sounds.  Exam reveals no gallop and no friction rub.   No murmur heard. Pulmonary/Chest: Effort normal and breath sounds normal. No respiratory distress.  Neurological: She is alert and oriented to person, place, and time. She exhibits normal muscle tone. Coordination normal.  Skin: Skin is warm and dry. No rash noted. No erythema.    ED Course  Procedures (including critical care time) Labs Review Labs Reviewed  CBG MONITORING, ED   The patient is advised to stop the Zofran and we will give her Phenergan for home and advised to followup with a primary care doctor, which she states she sees a primary care doctor in crit.  She also has a urologist and advised to follow up with them.  I reviewed her testing from the other day, which is very extensive and based on the fact, that she's not having any worsening symptoms we will forgo the patient is advised to return here as needed    Carlyle DollyChristopher W Tiny Rietz, PA-C 08/16/13 1948

## 2013-08-16 NOTE — ED Notes (Signed)
Pt requesting to speak with PA

## 2013-08-16 NOTE — ED Notes (Signed)
Pt c/o n/v/d with abdominal pain to lower quadrants x 2 months. Pt reports hx of endometriosis and intercystal cystitis, and notices that the pain and nausea comes with periods. Pt also reporting that when she takes zofran with celexa, she gets a tingling/numbness to her back/spine.

## 2013-08-16 NOTE — ED Notes (Signed)
Patient presents today with a chief complaint of tingling around spine and numbness and tingling to hands since taking percocet and zofran that were prescribed to patient Saturday for nausea, diarrhea, and vomiting. Patient believes these symptoms may be attributed to the medications but is still unable to eat or drink any fluids today and complains of worsening diarrhea.

## 2013-08-16 NOTE — ED Notes (Signed)
Patient up to desk stating she is concerned about her blood sugar and her pain is 10/10.

## 2013-11-13 ENCOUNTER — Encounter (HOSPITAL_COMMUNITY): Payer: Self-pay | Admitting: Emergency Medicine

## 2015-08-26 IMAGING — US US PELVIS COMPLETE
1 series · 13 of 25 positions shown · non-contrast
Comparison: 05/06/2004.

CLINICAL DATA: Pelvic pain. Previous removal of one of the ovaries
and fallopian tubes. The patient does not know which side.

EXAM:
TRANSABDOMINAL AND TRANSVAGINAL ULTRASOUND OF PELVIS
DOPPLER ULTRASOUND OF OVARIES
TECHNIQUE: Both transabdominal and transvaginal ultrasound examinations of the
pelvis were performed. Transabdominal technique was performed for
global imaging of the pelvis including uterus, ovaries, adnexal
regions, and pelvic cul-de-sac.
It was necessary to proceed with endovaginal exam following the
transabdominal exam to visualize the uterus and right ovary in
better detail. Color and duplex Doppler ultrasound was utilized to
evaluate blood flow to the ovaries.

[Series 1: us pelvis complete · 0.20mm/px · 13 of 64 slices shown]
[im 1/64]
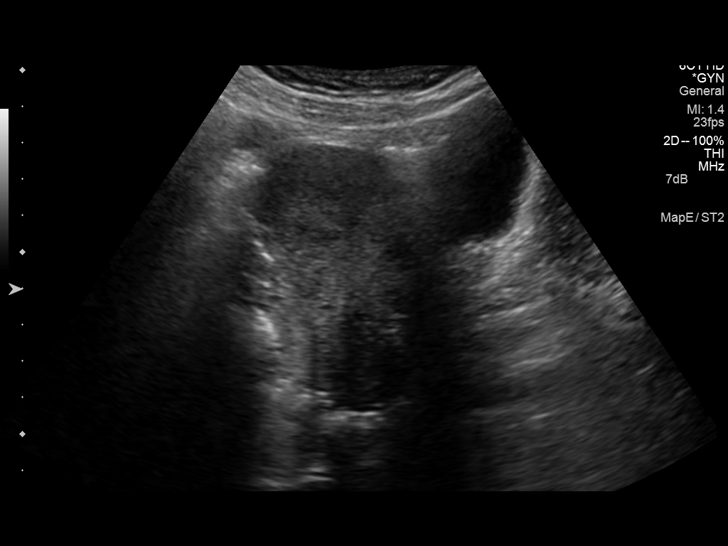
[im 6/64]
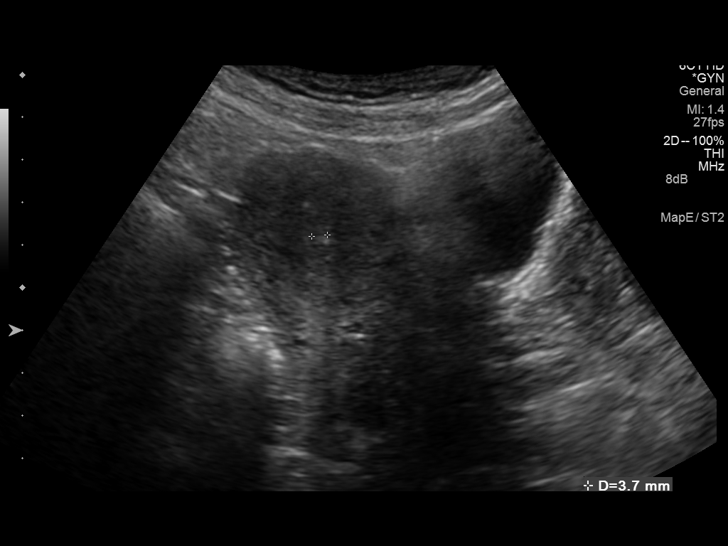
[im 11/64]
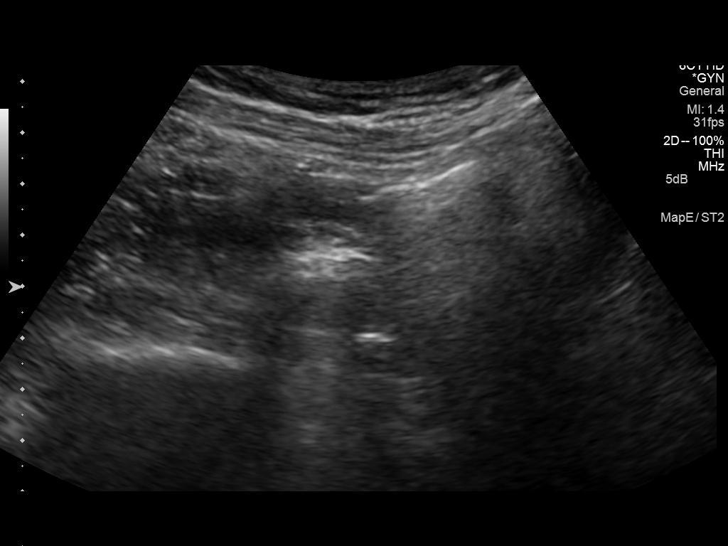
[im 16/64]
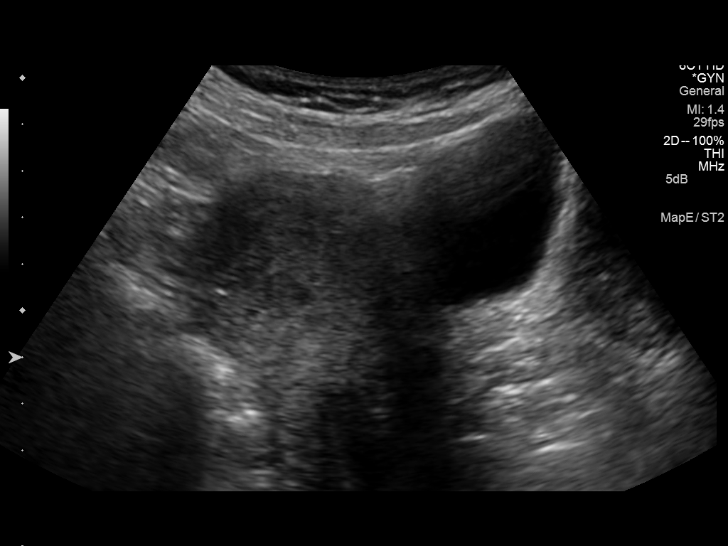
[im 22/64]
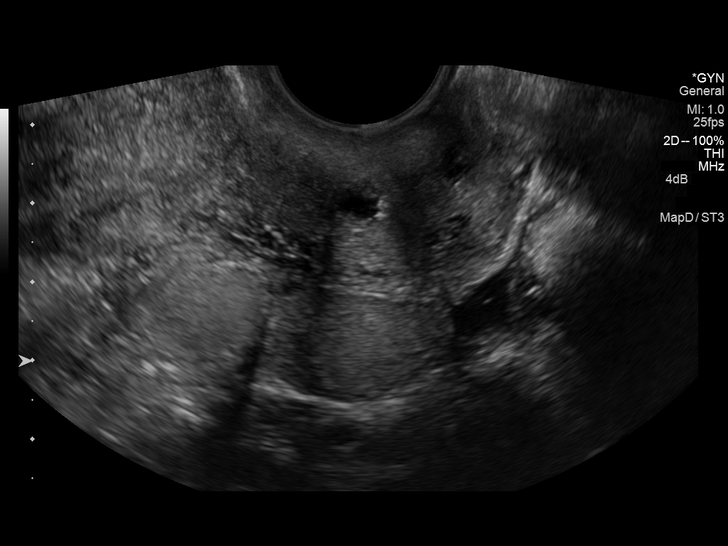
[im 27/64]
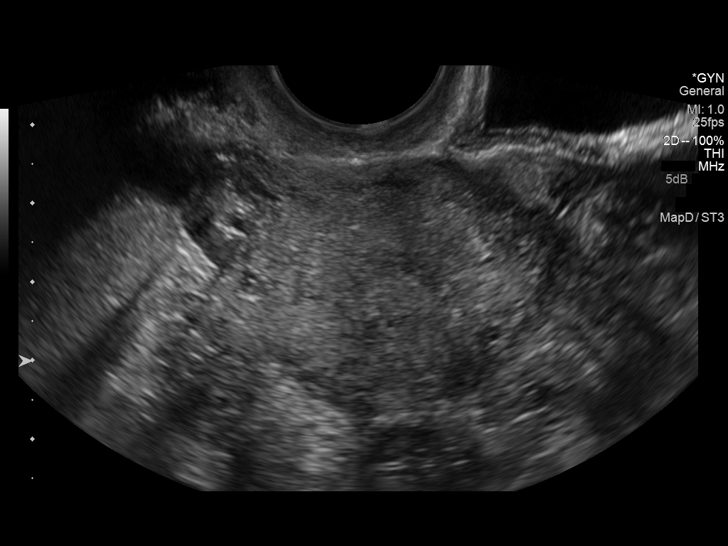
[im 32/64]
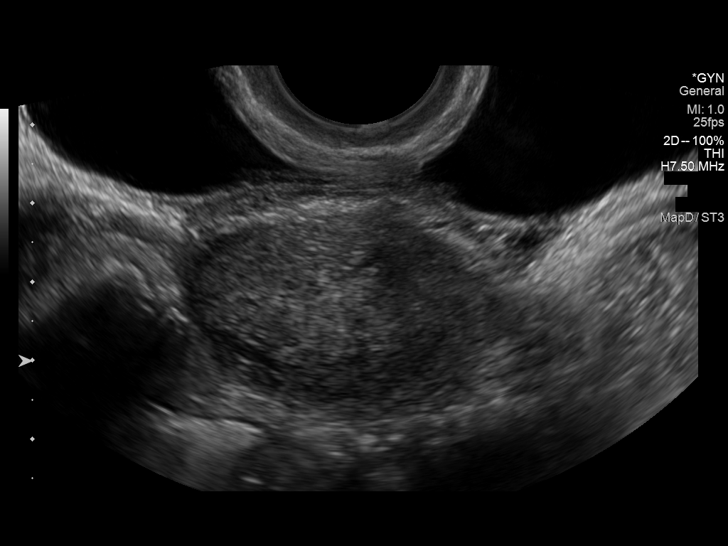
[im 37/64]
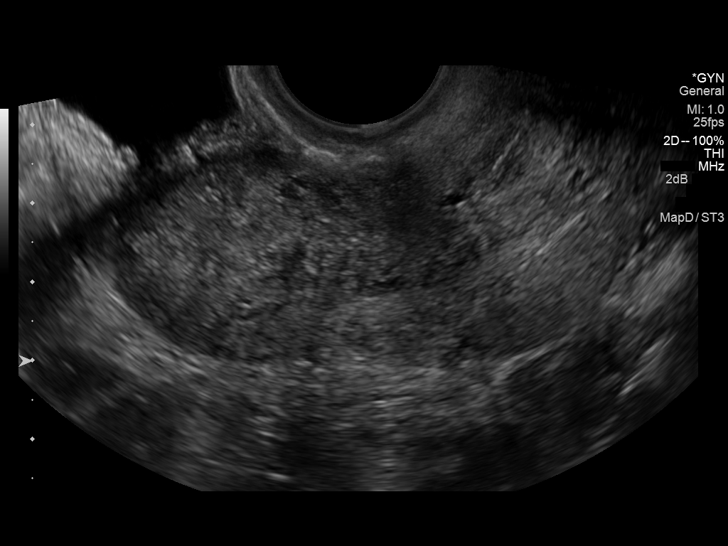
[im 43/64]
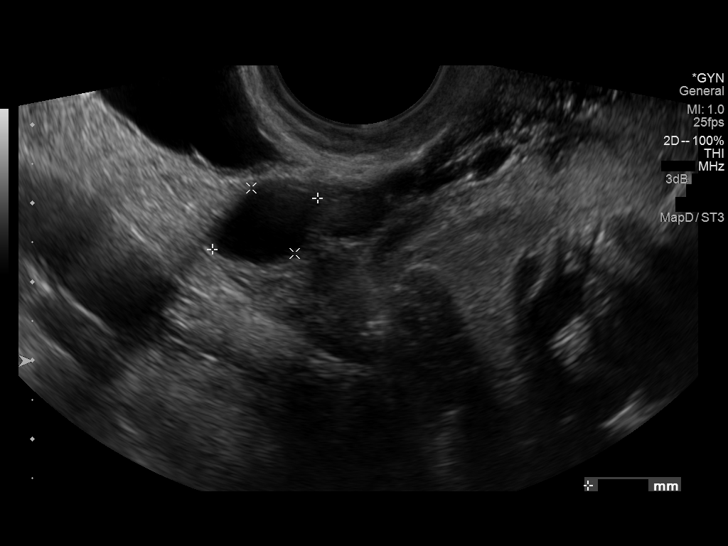
[im 48/64]
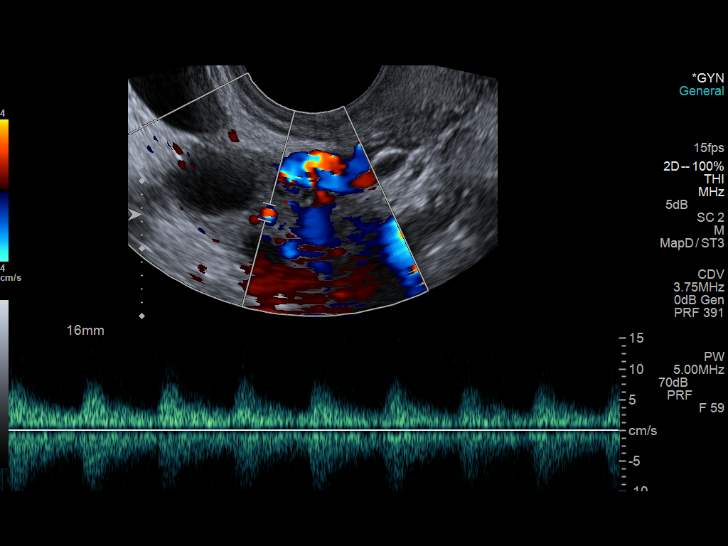
[im 53/64]
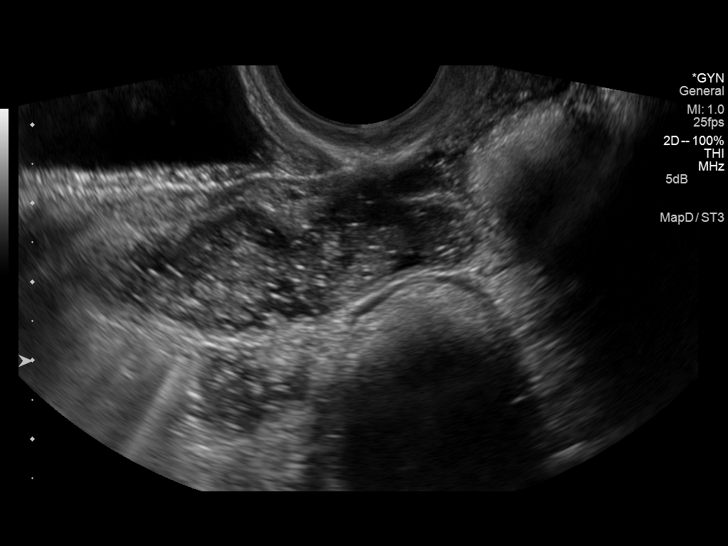
[im 58/64]
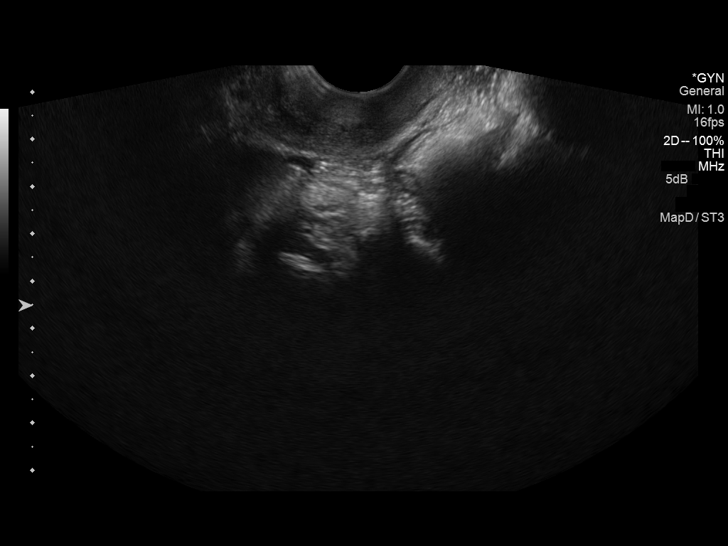
[im 64/64]
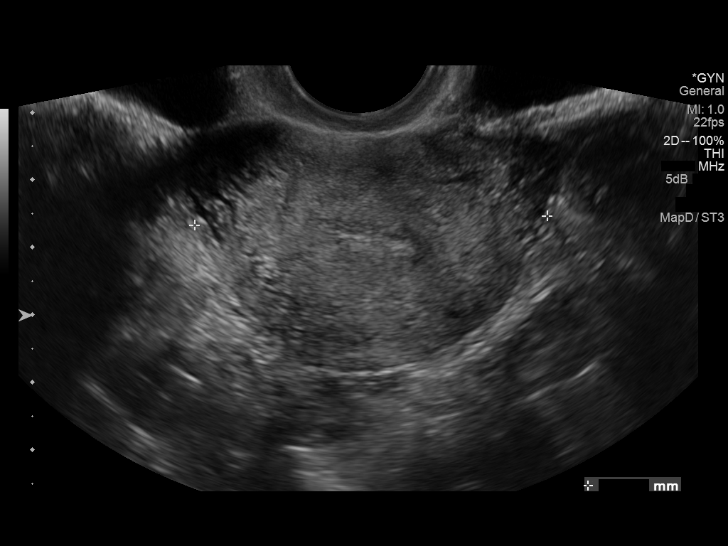

[13 of 25 positions shown; findings below may reference images not displayed]

FINDINGS: Uterus

Measurements: 7.6 x 5.2 x 3.6 cm. No fibroids or other mass
visualized. A probable C-section scar is noted.

Endometrium

Thickness: 5.4 mm.  No focal abnormality visualized.

Right ovary

Measurements: 3.1 x 2.5 x 1.7 cm. Normal appearance/no adnexal mass.

Left ovary

Surgically absent.

Pulsed Doppler evaluation of the right ovary demonstrates normal
low-resistance arterial and venous waveforms.

Other findings

No free fluid.
IMPRESSION: 1. No acute abnormality.
2. Status post left oophorectomy.

## 2016-03-15 ENCOUNTER — Encounter (HOSPITAL_COMMUNITY): Payer: Self-pay | Admitting: Emergency Medicine

## 2016-03-15 ENCOUNTER — Emergency Department (HOSPITAL_COMMUNITY)
Admission: EM | Admit: 2016-03-15 | Discharge: 2016-03-15 | Disposition: A | Discharge status: Home / Self Care | Payer: Managed Care, Other (non HMO) | Attending: Emergency Medicine | Admitting: Emergency Medicine

## 2016-03-15 DIAGNOSIS — R197 Diarrhea, unspecified: Secondary | ICD-10-CM | POA: Insufficient documentation

## 2016-03-15 DIAGNOSIS — Z91040 Latex allergy status: Secondary | ICD-10-CM | POA: Insufficient documentation

## 2016-03-15 DIAGNOSIS — Z79899 Other long term (current) drug therapy: Secondary | ICD-10-CM | POA: Insufficient documentation

## 2016-03-15 DIAGNOSIS — Z7982 Long term (current) use of aspirin: Secondary | ICD-10-CM | POA: Insufficient documentation

## 2016-03-15 DIAGNOSIS — Z87891 Personal history of nicotine dependence: Secondary | ICD-10-CM | POA: Insufficient documentation

## 2016-03-15 DIAGNOSIS — R112 Nausea with vomiting, unspecified: Secondary | ICD-10-CM

## 2016-03-15 LAB — URINALYSIS, ROUTINE W REFLEX MICROSCOPIC
BACTERIA UA: NONE SEEN
BILIRUBIN URINE: NEGATIVE
Glucose, UA: NEGATIVE mg/dL
KETONES UR: 20 mg/dL — AB
LEUKOCYTES UA: NEGATIVE
NITRITE: NEGATIVE
Protein, ur: 30 mg/dL — AB
SPECIFIC GRAVITY, URINE: 1.016 (ref 1.005–1.030)
pH: 5 (ref 5.0–8.0)

## 2016-03-15 LAB — COMPREHENSIVE METABOLIC PANEL
ALBUMIN: 4.2 g/dL (ref 3.5–5.0)
ALK PHOS: 38 U/L (ref 38–126)
ALT: 12 U/L — ABNORMAL LOW (ref 14–54)
AST: 16 U/L (ref 15–41)
Anion gap: 9 (ref 5–15)
BUN: 5 mg/dL — ABNORMAL LOW (ref 6–20)
CHLORIDE: 107 mmol/L (ref 101–111)
CO2: 23 mmol/L (ref 22–32)
Calcium: 9.2 mg/dL (ref 8.9–10.3)
Creatinine, Ser: 0.66 mg/dL (ref 0.44–1.00)
GFR calc Af Amer: >60 mL/min (ref >60)
GFR calc non Af Amer: >60 mL/min (ref >60)
Glucose, Bld: 94 mg/dL (ref 65–99)
Potassium: 3.7 mmol/L (ref 3.5–5.1)
SODIUM: 139 mmol/L (ref 135–145)
TOTAL PROTEIN: 7.7 g/dL (ref 6.5–8.1)
Total Bilirubin: 0.8 mg/dL (ref 0.3–1.2)

## 2016-03-15 LAB — CBC WITH DIFFERENTIAL/PLATELET
BASOS ABS: 0 10*3/uL (ref 0.0–0.1)
BASOS PCT: 1 %
EOS ABS: 0 10*3/uL (ref 0.0–0.7)
EOS PCT: 0 %
HCT: 38.5 % (ref 36.0–46.0)
HEMOGLOBIN: 13.3 g/dL (ref 12.0–15.0)
LYMPHS ABS: 1.8 10*3/uL (ref 0.7–4.0)
Lymphocytes Relative: 25 %
MCH: 32.1 pg (ref 26.0–34.0)
MCHC: 34.5 g/dL (ref 30.0–36.0)
MCV: 93 fL (ref 78.0–100.0)
Monocytes Absolute: 0.4 10*3/uL (ref 0.1–1.0)
Monocytes Relative: 6 %
NEUTROS PCT: 68 %
Neutro Abs: 4.9 10*3/uL (ref 1.7–7.7)
PLATELETS: 207 10*3/uL (ref 150–400)
RBC: 4.14 MIL/uL (ref 3.87–5.11)
RDW: 12.5 % (ref 11.5–15.5)
WBC: 7.2 10*3/uL (ref 4.0–10.5)

## 2016-03-15 LAB — RAPID URINE DRUG SCREEN, HOSP PERFORMED
Amphetamines: NOT DETECTED
Barbiturates: NOT DETECTED
Benzodiazepines: POSITIVE — AB
COCAINE: NOT DETECTED
OPIATES: NOT DETECTED
TETRAHYDROCANNABINOL: NOT DETECTED

## 2016-03-15 LAB — CK: CK TOTAL: 82 U/L (ref 38–234)

## 2016-03-15 LAB — I-STAT BETA HCG BLOOD, ED (MC, WL, AP ONLY)

## 2016-03-15 MED ORDER — SODIUM CHLORIDE 0.9 % IV BOLUS (SEPSIS)
1000.0000 mL | Freq: Once | INTRAVENOUS | Status: AC
  Administered 2016-03-15: 1000 mL via INTRAVENOUS

## 2016-03-15 MED ORDER — ONDANSETRON HCL 4 MG/2ML IJ SOLN
4.0000 mg | Freq: Once | INTRAMUSCULAR | Status: DC
  Administered 2016-03-15: 4 mg via INTRAVENOUS
  Filled 2016-03-15: qty 2

## 2016-03-15 NOTE — ED Triage Notes (Addendum)
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 States heart pounds, has "panic attacks" where heart is racing,  Dr Marcelyn Bruinsobert Evans prescribes meds for interstitial cysitits, has an appt on tues to see him.  Pt states that "at times I grit my teeth, I feel my heart pounding, I am having diarrhea constantly-- causing my rectum area to hurt. Once I got on celexa everything started. "

## 2016-03-15 NOTE — Discharge Instructions (Signed)
Please continue drinking plenty of Pedialyte and water throughout the day. Please follow up with a primary care provider regarding today's visit within 1 week. There is information here for [facility] and I have also attached a Engineer, building servicesfinancial resource guide.  Get help right away if: You have pain in your chest, neck, arm, or jaw. You feel extremely weak or you faint. You have persistent vomiting. You see blood in your vomit. Your vomit looks like black coffee grounds. You have bloody or black stools or stools that look like tar. You have a severe headache, a stiff neck, or both. You have a rash. You have severe pain, cramping, or bloating in your abdomen. You have trouble breathing or you are breathing very quickly. Your heart is beating very quickly. Your skin feels cold and clammy. You feel confused. You have pain when you urinate. You have signs of dehydration, such as: Dark urine, very little urine, or no urine. Cracked lips. Dry mouth. Sunken eyes. Sleepiness. Weakness. Get help right away if: You have chest pain. You feel extremely weak or you faint. You have bloody or black stools or stools that look like tar. You have severe pain, cramping, or bloating in your abdomen. You have trouble breathing or you are breathing very quickly. Your heart is beating very quickly. Your skin feels cold and clammy. You feel confused. You have signs of dehydration, such as: Dark urine, very little urine, or no urine. Cracked lips. Dry mouth. Sunken eyes. Sleepiness. Weakness.

## 2016-03-15 NOTE — ED Notes (Signed)
Denies nausea at this time.  

## 2016-03-15 NOTE — ED Provider Notes (Signed)
[facility] DEPT Provider Note   CSN: [id number] Arrival date & time: 03/15/16  1143     History   Chief Complaint No chief complaint on file.   HPI Melissa Sellers is a 36 y.o. female presenting today with complaints of medication reaction since wednesday. She states She reports associated anxiety, vomiting, diarrhea, palpitations, chest pain that is intermittent. She states she has been on celexa for 2 years and has been recently started taking less a of 6 months ago. She states she has been taking her xanax about 7 years ago and has also lowered her dose as of 1 year. She states Norco for 10 years and has also decreased dosage for about one year. She states she has changed generic medications for celexa an xanax several times and sometimes gives her this reaction. She states that her urologist thought she may have had serotonin syndrome in October or November. She states she "googled the heck out of serotonin syndrome" and states she is sure she had it, but never got seen. She states this time is different from her episode in October or November and it is worse. She states the celexa "reacts with everything" stating that she feels very sick every time she takes a new medicine, even advil and norco that she has been taking "for years". She states her urologist prescribes her xanax and celexa. She denies having a psychiatrist or PCP at this time. She admits to chills, cloudy vision, changes in ambulation, blood in stools or vomit. She denies changes in urination, fevers, SOB. She denies any chest pain at this time.    The history is provided by the patient. No language interpreter was used.    Past Medical History:  Diagnosis Date  . Bladder spasm   . Endometriosis   . Frequency of urination   . Herpes simplex   . Interstitial cystitis   . Migraines   . Nocturia   . Pelvic pain in female   . Urgency of urination     Patient Active Problem List   Diagnosis Date Noted  . IC  (interstitial cystitis) 07/06/2012  . Endometriosis 07/06/2012    Past Surgical History:  Procedure Laterality Date  . AUGMENTATION MAMMAPLASTY  2009   IMPLANTS  . CYSTO/ HOD/ INSTILLATION THERAPY  AUG 2011  . ECTOPIC PREGNANCY SURGERY  2010   right salpingectomy  . MULTIPLE LAPAROSCOPIES FOR ENDOMETRIOSIS  LAST ONE 2010  . OOPHORECTOMY  2013   LSO  . PELVIC LAPAROSCOPY     DL  LSO    OB History    Gravida Para Term Preterm AB Living   4 1 1   3 1    SAB TAB Ectopic Multiple Live Births   2   1 0         Home Medications    Prior to Admission medications   Medication Sig Start Date End Date Taking? Authorizing Provider  ALPRAZolam Prudy Feeler(XANAX) 1 MG tablet Take 0.5-1 mg by mouth 2 (two) times daily as needed for anxiety.    Yes Historical Provider, MD  aspirin-acetaminophen-caffeine (EXCEDRIN MIGRAINE) [phone] MG per tablet Take 2 tablets by mouth every 6 (six) hours as needed for headache.   Yes Historical Provider, MD  citalopram (CELEXA) 40 MG tablet Take 80 mg by mouth at bedtime.   Yes Historical Provider, MD  loratadine (CLARITIN) 10 MG tablet Take 10 mg by mouth daily.   Yes Historical Provider, MD  HYDROcodone-acetaminophen (NORCO/VICODIN) 5-325 MG per tablet Take 1-2  tablets by mouth every 4 (four) hours as needed for moderate pain.    Historical Provider, MD  oxyCODONE-acetaminophen (PERCOCET/ROXICET) 5-325 MG per tablet Take 2 tablets by mouth every 4 (four) hours as needed for severe pain. Patient not taking: Reported on 03/15/2016 08/12/13   Glynn Octave, MD  oxyCODONE-acetaminophen (PERCOCET/ROXICET) 5-325 MG per tablet Take 1 tablet by mouth every 6 (six) hours as needed for severe pain. Patient not taking: Reported on 03/15/2016 08/16/13   Charlestine Night, PA-C  promethazine (PHENERGAN) 25 MG tablet Take 1 tablet (25 mg total) by mouth every 8 (eight) hours as needed for nausea or vomiting. Patient not taking: Reported on 03/15/2016 08/16/13   Charlestine Night, PA-C      Family History Family History  Problem Relation Age of Onset  . Breast cancer Paternal Grandmother     Social History Social History  Substance Use Topics  . Smoking status: Former Smoker    Packs/day: 0.15    Years: 8.00    Types: Cigarettes  . Smokeless tobacco: Never Used     Comment: STATES HAS SMOKED A TOTAL OF 8 YRS--  OFF AND ON   . Alcohol use No     Allergies   Amitriptyline; Ciprofloxacin; Doxycycline; Penicillins; Shellfish allergy; Sulfa antibiotics; Tramadol; Flagyl [metronidazole hcl]; and Latex   Review of Systems Review of Systems  Constitutional: Positive for chills. Negative for fever.  Eyes: Positive for visual disturbance.  Respiratory: Negative for shortness of breath.   Cardiovascular: Positive for chest pain and palpitations.  Gastrointestinal: Positive for diarrhea, nausea and vomiting.  Genitourinary: Negative for difficulty urinating and dysuria.  Skin: Negative for rash and wound.  All other systems reviewed and are negative.    Physical Exam Updated Vital Signs BP 120/83   Pulse 87   Temp 99.2 F (37.3 C) (Oral)   Resp 16   Ht 5' (1.524 m)   Wt 49 kg   LMP 03/09/2016   SpO2 100%   BMI 21.09 kg/m   Physical Exam  Constitutional: She is oriented to person, place, and time. She appears well-developed and well-nourished.  Well appearing  HENT:  Head: Normocephalic and atraumatic.  Nose: Nose normal.  Eyes: Conjunctivae and EOM are normal.  Neck: Normal range of motion.  Cardiovascular: Normal rate and normal heart sounds.   No murmur heard. Pulmonary/Chest: Effort normal and breath sounds normal. No respiratory distress. She has no wheezes. She has no rales.  Normal work of breathing. No respiratory distress noted.   Abdominal: Soft. There is tenderness. There is no rebound and no guarding.  Soft. Tender at lower abdomen. She states she has interstitial cystitis and this is normal for her. Otherwise abdomen is soft and  nontender. Negative murphy's sign. No focal tenderness at mcburney's point. No CVA tenderness.   Musculoskeletal: Normal range of motion. She exhibits no tenderness.  Neurological: She is alert and oriented to person, place, and time.  Cranial Nerves:  III,IV, VI: ptosis not present, extra-ocular movements intact bilaterally, direct and consensual pupillary light reflexes intact bilaterally V: facial sensation, jaw opening, and bite strength equal bilaterally VII: eyebrow raise, eyelid close, smile, frown, pucker equal bilaterally VIII: hearing grossly normal bilaterally  IX,X: palate elevation and swallowing intact XI: bilateral shoulder shrug and lateral head rotation equal and strong XII: midline tongue extension  Negative pronator drift, negative Romberg, negative RAM's, negative heel-to-shin, negative finger to nose.    Sensory intact.  Muscle strength 5/5 Patient able to ambulate without difficulty.  She is mildly hyperreflexic on exam. Not sure if true hyperflexia on her exam.  Negative Babinski's sign.   Skin: Skin is warm.  Psychiatric: She has a normal mood and affect. Her behavior is normal.  Nursing note and vitals reviewed.    ED Treatments / Results  Labs (all labs ordered are listed, but only abnormal results are displayed) Labs Reviewed  COMPREHENSIVE METABOLIC PANEL - Abnormal; Notable for the following:       Result Value   BUN 5 (*)    ALT 12 (*)    All other components within normal limits  RAPID URINE DRUG SCREEN, HOSP PERFORMED - Abnormal; Notable for the following:    Benzodiazepines POSITIVE (*)    All other components within normal limits  URINALYSIS, ROUTINE W REFLEX MICROSCOPIC - Abnormal; Notable for the following:    Hgb urine dipstick SMALL (*)    Ketones, ur 20 (*)    Protein, ur 30 (*)    Squamous Epithelial / LPF 0-5 (*)    All other components within normal limits  CBC WITH DIFFERENTIAL/PLATELET  CK  I-STAT BETA HCG BLOOD, ED ([facility], [facility], [facility]  ONLY)    EKG  EKG Interpretation  Date/Time:  Sunday March 15 2016 16:55:38 EST Ventricular Rate:  83 PR Interval:    QRS Duration: 89 QT Interval:  424 QTC Calculation: 499 R Axis:   70 Text Interpretation:  Sinus rhythm Borderline T wave abnormalities Borderline prolonged QT interval No previous ECGs available Confirmed by YAO  MD, DAVID ([id number]) on 03/15/2016 7:23:42 PM       Radiology No results found.  Procedures Procedures (including critical care time)  Medications Ordered in ED Medications  sodium chloride 0.9 % bolus 1,000 mL (0 mLs Intravenous Stopped 03/15/16 1841)     Initial Impression / Assessment and Plan / ED Course  I have reviewed the triage vital signs and the nursing notes.  Pertinent labs & imaging results that were available during my care of the patient were reviewed by me and considered in my medical decision making (see chart for details).    Patient here with complaints of reaction to medications. 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 She is in no apparent distress here. Hemodynamically stable. She is neurologically intact. Patient did show some mild hyperreflexia on exam. I'm not sure if this is a true finding or if this was exaggerated by patient. Patient did extensive research on serotonin syndrome online and is well aware of symptoms. Patient able to stand and ambulate without difficulty, which is reassuring. Abdomen soft and minimally tender in lower abdomen, she states this is normal for her since she has history of interstitial cystitis and is being seen and evaluated by her urologist. Abdomen is otherwise soft and nontender, no rebound tenderness or guarding. Negative Murphy sign. No focal tenderness at McBurney's point. No CVA tenderness. Lab work is reassuring. EKG shows no acute findings. Patient is tolerating by mouth prior to discharge. Patient encouraged to follow up with a primary care provider within one  week regarding today's visit. Patient has been given Engineer, building services. She is recommended to have a primary care provider or psychiatrist for evaluation of her medication management and possible change of medication. Patient and patient's mother both verbalized understanding. They're both agreeable with discharge and agree with assessment and plan. Reasons to immediately return to the emergency department discussed. Patient seems safe for discharge at this time. Patient case  discussed with Dr. Silverio Lay who agreed with assessment and plan.    Final Clinical Impressions(s) / ED Diagnoses   Final diagnoses:  Nausea vomiting and diarrhea    New Prescriptions New Prescriptions   No medications on file     [address] [place], [place] 03/15/16 1949    Charlynne Pander, MD 03/15/16 [phone]

## 2016-12-15 ENCOUNTER — Inpatient Hospital Stay (HOSPITAL_COMMUNITY)
Admission: AD | Admit: 2016-12-15 | Discharge: 2016-12-15 | Disposition: A | Discharge status: Home / Self Care | Payer: Self-pay | Source: Ambulatory Visit | Attending: Family Medicine | Admitting: Family Medicine

## 2016-12-15 ENCOUNTER — Encounter (HOSPITAL_COMMUNITY): Payer: Self-pay | Admitting: *Deleted

## 2016-12-15 DIAGNOSIS — R1032 Left lower quadrant pain: Secondary | ICD-10-CM | POA: Insufficient documentation

## 2016-12-15 DIAGNOSIS — Z90721 Acquired absence of ovaries, unilateral: Secondary | ICD-10-CM | POA: Insufficient documentation

## 2016-12-15 DIAGNOSIS — M549 Dorsalgia, unspecified: Secondary | ICD-10-CM | POA: Insufficient documentation

## 2016-12-15 DIAGNOSIS — Z880 Allergy status to penicillin: Secondary | ICD-10-CM | POA: Insufficient documentation

## 2016-12-15 DIAGNOSIS — Z9079 Acquired absence of other genital organ(s): Secondary | ICD-10-CM | POA: Insufficient documentation

## 2016-12-15 DIAGNOSIS — Z881 Allergy status to other antibiotic agents status: Secondary | ICD-10-CM | POA: Insufficient documentation

## 2016-12-15 DIAGNOSIS — Z87891 Personal history of nicotine dependence: Secondary | ICD-10-CM | POA: Insufficient documentation

## 2016-12-15 DIAGNOSIS — Z79899 Other long term (current) drug therapy: Secondary | ICD-10-CM | POA: Insufficient documentation

## 2016-12-15 DIAGNOSIS — R102 Pelvic and perineal pain: Secondary | ICD-10-CM | POA: Diagnosis present

## 2016-12-15 LAB — CBC WITH DIFFERENTIAL/PLATELET
Basophils Absolute: 0 K/uL (ref 0.0–0.1)
Basophils Relative: 0 %
Eosinophils Absolute: 0.2 K/uL (ref 0.0–0.7)
Eosinophils Relative: 3 %
HCT: 37.7 % (ref 36.0–46.0)
Hemoglobin: 12.7 g/dL (ref 12.0–15.0)
Lymphocytes Relative: 32 %
Lymphs Abs: 1.8 K/uL (ref 0.7–4.0)
MCH: 31.9 pg (ref 26.0–34.0)
MCHC: 33.7 g/dL (ref 30.0–36.0)
MCV: 94.7 fL (ref 78.0–100.0)
Monocytes Absolute: 0.2 K/uL (ref 0.1–1.0)
Monocytes Relative: 4 %
Neutro Abs: 3.4 K/uL (ref 1.7–7.7)
Neutrophils Relative %: 61 %
Platelets: 182 K/uL (ref 150–400)
RBC: 3.98 MIL/uL (ref 3.87–5.11)
RDW: 12.2 % (ref 11.5–15.5)
WBC: 5.6 K/uL (ref 4.0–10.5)

## 2016-12-15 LAB — COMPREHENSIVE METABOLIC PANEL WITH GFR
ALT: 15 U/L (ref 14–54)
AST: 17 U/L (ref 15–41)
Albumin: 3.8 g/dL (ref 3.5–5.0)
Alkaline Phosphatase: 46 U/L (ref 38–126)
Anion gap: 5 (ref 5–15)
BUN: 10 mg/dL (ref 6–20)
CO2: 30 mmol/L (ref 22–32)
Calcium: 8.7 mg/dL — ABNORMAL LOW (ref 8.9–10.3)
Chloride: 99 mmol/L — ABNORMAL LOW (ref 101–111)
Creatinine, Ser: 0.66 mg/dL (ref 0.44–1.00)
GFR calc Af Amer: >60 mL/min
GFR calc non Af Amer: >60 mL/min
Glucose, Bld: 95 mg/dL (ref 65–99)
Potassium: 4.1 mmol/L (ref 3.5–5.1)
Sodium: 134 mmol/L — ABNORMAL LOW (ref 135–145)
Total Bilirubin: 0.5 mg/dL (ref 0.3–1.2)
Total Protein: 7.6 g/dL (ref 6.5–8.1)

## 2016-12-15 LAB — URINALYSIS, ROUTINE W REFLEX MICROSCOPIC
Bilirubin Urine: NEGATIVE
Glucose, UA: NEGATIVE mg/dL
Ketones, ur: NEGATIVE mg/dL
Leukocytes, UA: NEGATIVE
Nitrite: NEGATIVE
Protein, ur: NEGATIVE mg/dL
Specific Gravity, Urine: 1.004 — ABNORMAL LOW (ref 1.005–1.030)
pH: 6 (ref 5.0–8.0)

## 2016-12-15 LAB — WET PREP, GENITAL
CLUE CELLS WET PREP: NONE SEEN
Sperm: NONE SEEN
Trich, Wet Prep: NONE SEEN
YEAST WET PREP: NONE SEEN

## 2016-12-15 LAB — POCT PREGNANCY, URINE: PREG TEST UR: NEGATIVE

## 2016-12-15 MED ORDER — KETOROLAC TROMETHAMINE 60 MG/2ML IM SOLN
60.0000 mg | INTRAMUSCULAR | Status: DC
  Filled 2016-12-15: qty 2

## 2016-12-15 NOTE — MAU Provider Note (Signed)
History     CSN: [id number]  Arrival date and time: 12/15/16 [id number]   First Provider Initiated Contact with Patient 12/15/16 1017      Chief Complaint  Patient presents with  . Cyst  . Abdominal Pain  . Back Pain  . Vaginal Itching   HPI  Ms. Melissa Sellers is a 36 y.o. [phone] non-pregnant female presenting to MAU with complaints of lower LT abdominal and back pain, sharp, intermittent cramping x 5 days. She reports at times she "can feel something moving around on the lower LT side of pelvis. She reports taking Tylenol 325 mg and 1 hydrocodone this morning. Her LMP was 11/29 and she is still currently having BRB; no clots. She also complains of vaginal itching, but no discharge. She denies N/V/D, constipation, fever, or chills. She has a h/o endometriosis, ovarian cysts, RT salpingectomy in 2010, LSO in 2013 (Dr. Eda PaschalGottsegen). She is not currently sexually active; her last SI was 1 year ago. She and her mother are "worried that it could be a cyst or endometriosis flaring up."  Past Medical History:  Diagnosis Date  . Bladder spasm   . Endometriosis   . Frequency of urination   . Herpes simplex   . Interstitial cystitis   . Migraines   . Nocturia   . Pelvic pain in female   . Urgency of urination     Past Surgical History:  Procedure Laterality Date  . AUGMENTATION MAMMAPLASTY  2009   IMPLANTS  . CYSTO/ HOD/ INSTILLATION THERAPY  AUG 2011  . ECTOPIC PREGNANCY SURGERY  2010   right salpingectomy  . MULTIPLE LAPAROSCOPIES FOR ENDOMETRIOSIS  LAST ONE 2010  . OOPHORECTOMY  2013   LSO  . PELVIC LAPAROSCOPY     DL  LSO    Family History  Problem Relation Age of Onset  . Breast cancer Paternal Grandmother     Social History   Tobacco Use  . Smoking status: Former Smoker    Packs/day: 0.15    Years: 8.00    Pack years: 1.20    Types: Cigarettes  . Smokeless tobacco: Never Used  . Tobacco comment: STATES HAS SMOKED A TOTAL OF 8 YRS--  OFF AND ON   Substance Use  Topics  . Alcohol use: No  . Drug use: Not on file    Allergies:  Allergies  Allergen Reactions  . Amitriptyline Other (See Comments)    MUSCLE SPASMS  . Ciprofloxacin Hives  . Codeine Other (See Comments)    Itching, hives,   . Diflucan [Fluconazole]     Interacts with Celexa  . Doxycycline Other (See Comments)    "flu-like symptoms."    . Penicillins Hives  . Shellfish Allergy Hives and Swelling  . Sulfa Antibiotics Other (See Comments)    SEIZURES  . Tramadol Other (See Comments)    Muscle spasms  . Flagyl [Metronidazole Hcl] Rash  . Latex Rash and Other (See Comments)    No latex use since age 36. Yeast infection with condom use.    Medications Prior to Admission  Medication Sig Dispense Refill Last Dose  . ALPRAZolam (XANAX) 1 MG tablet Take 0.5-1 mg by mouth 2 (two) times daily as needed for anxiety.    12/14/2016 at Unknown time  . citalopram (CELEXA) 40 MG tablet Take 80 mg by mouth at bedtime.   12/14/2016 at Unknown time  . HYDROcodone-acetaminophen (NORCO/VICODIN) 5-325 MG per tablet Take 1-2 tablets by mouth every 4 (four) hours as needed  for moderate pain.   12/15/2016 at Unknown time    Review of Systems  Constitutional: Negative.   HENT: Negative.   Eyes: Negative.   Respiratory: Negative.   Cardiovascular: Negative.   Gastrointestinal: Positive for abdominal pain (Lower LT side).  Endocrine: Negative.   Genitourinary: Positive for pelvic pain (lower LT side) and vaginal bleeding.  Musculoskeletal: Positive for back pain.  Skin: Negative.   Allergic/Immunologic: Negative.   Neurological: Negative.   Hematological: Negative.   Psychiatric/Behavioral: Negative.    Physical Exam   Blood pressure 115/68, pulse 94, resp. rate 16, weight 52.6 kg (116 lb), last menstrual period 12/10/2016, SpO2 97 %.  Physical Exam  Nursing note and vitals reviewed. Constitutional: She is oriented to person, place, and time. She appears well-developed and well-nourished.   HENT:  Head: Normocephalic.  Eyes: Pupils are equal, round, and reactive to light.  Neck: Normal range of motion.  Cardiovascular: Normal rate, regular rhythm and normal heart sounds.  Respiratory: Effort normal and breath sounds normal.  GI: Soft. Bowel sounds are normal.  Genitourinary:  Genitourinary Comments: Uterus: non-tender, Bimanual exam: moderate amount of dark red blood, closed/long/firm, no CMT or friability, mild LT adnexal tenderness -- pt tolerated well, no palpable masses // self-collection of wet prep  Musculoskeletal: Normal range of motion.  Neurological: She is alert and oriented to person, place, and time. She has normal reflexes.  Skin: Skin is warm and dry.  Psychiatric: She has a normal mood and affect. Her behavior is normal. Judgment and thought content normal.    MAU Course  Procedures  MDM [facility] UPT CBC with Diff CMP Wet Prep Offered Toradol 60 mg -- patient declined stating "I can't take NSAIDs, because they interact with Celexa."  Results for orders placed or performed during the hospital encounter of 12/15/16 (from the past 24 hour(s))  Urinalysis, Routine w reflex microscopic     Status: Abnormal   Collection Time: 12/15/16  9:58 AM  Result Value Ref Range   Color, Urine YELLOW YELLOW   APPearance CLEAR CLEAR   Specific Gravity, Urine 1.004 (L) 1.005 - 1.030   pH 6.0 5.0 - 8.0   Glucose, UA NEGATIVE NEGATIVE mg/dL   Hgb urine dipstick LARGE (A) NEGATIVE   Bilirubin Urine NEGATIVE NEGATIVE   Ketones, ur NEGATIVE NEGATIVE mg/dL   Protein, ur NEGATIVE NEGATIVE mg/dL   Nitrite NEGATIVE NEGATIVE   Leukocytes, UA NEGATIVE NEGATIVE   RBC / HPF 0-5 0 - 5 RBC/hpf   WBC, UA 0-5 0 - 5 WBC/hpf   Bacteria, UA RARE (A) NONE SEEN   Squamous Epithelial / LPF 0-5 (A) NONE SEEN  Pregnancy, urine POC     Status: None   Collection Time: 12/15/16 10:09 AM  Result Value Ref Range   Preg Test, Ur NEGATIVE NEGATIVE  Wet prep, genital     Status: Abnormal    Collection Time: 12/15/16 10:44 AM  Result Value Ref Range   Yeast Wet Prep HPF POC NONE SEEN NONE SEEN   Trich, Wet Prep NONE SEEN NONE SEEN   Clue Cells Wet Prep HPF POC NONE SEEN NONE SEEN   WBC, Wet Prep HPF POC FEW (A) NONE SEEN   Sperm NONE SEEN   CBC with Differential/Platelet     Status: None   Collection Time: 12/15/16 10:55 AM  Result Value Ref Range   WBC 5.6 4.0 - 10.5 K/uL   RBC 3.98 3.87 - 5.11 MIL/uL   Hemoglobin 12.7 12.0 - 15.0 g/dL  HCT 37.7 36.0 - 46.0 %   MCV 94.7 78.0 - 100.0 fL   MCH 31.9 26.0 - 34.0 pg   MCHC 33.7 30.0 - 36.0 g/dL   RDW 78.212.2 95.611.5 - 21.315.5 %   Platelets 182 150 - 400 K/uL   Neutrophils Relative % 61 %   Neutro Abs 3.4 1.7 - 7.7 K/uL   Lymphocytes Relative 32 %   Lymphs Abs 1.8 0.7 - 4.0 K/uL   Monocytes Relative 4 %   Monocytes Absolute 0.2 0.1 - 1.0 K/uL   Eosinophils Relative 3 %   Eosinophils Absolute 0.2 0.0 - 0.7 K/uL   Basophils Relative 0 %   Basophils Absolute 0.0 0.0 - 0.1 K/uL  Comprehensive metabolic panel     Status: Abnormal   Collection Time: 12/15/16 10:55 AM  Result Value Ref Range   Sodium 134 (L) 135 - 145 mmol/L   Potassium 4.1 3.5 - 5.1 mmol/L   Chloride 99 (L) 101 - 111 mmol/L   CO2 30 22 - 32 mmol/L   Glucose, Bld 95 65 - 99 mg/dL   BUN 10 6 - 20 mg/dL   Creatinine, Ser 0.860.66 0.44 - 1.00 mg/dL   Calcium 8.7 (L) 8.9 - 10.3 mg/dL   Total Protein 7.6 6.5 - 8.1 g/dL   Albumin 3.8 3.5 - 5.0 g/dL   AST 17 15 - 41 U/L   ALT 15 14 - 54 U/L   Alkaline Phosphatase 46 38 - 126 U/L   Total Bilirubin 0.5 0.3 - 1.2 mg/dL   GFR calc non Af Amer >60 >60 mL/min   GFR calc Af Amer >60 >60 mL/min   Anion gap 5 5 - 15    Assessment and Plan  Pelvic pain  - Plan: US PELVIS (TRANSABDOMINAL ONLY) in 2 wks - Discussed with patient my findings and the decision to order an outpatient U/S vs. emergently today - Informed patient that she does NOT have an ovary on the LT side and the chances are unlikely that she has an ovarian cyst  on her LT side. - Advised NSAIDs would probably be better drug to manage her pelvic pain, but she could use the Vicodin she has at home  - Will need to f/u with MD in [facility] 2 wks after U/S to discuss results and f/u plan of care - Information provided on pelvic pain  - Discharge home - Patient verbalized an understanding of the plan of care and agrees.   Raelyn Moraolitta Navaya Wiatrek, MSN, CNM 12/15/2016, 10:17 AM

## 2016-12-15 NOTE — Discharge Instructions (Signed)
In late 2019, the [facility] will be moving to the [place] campus. At that time, the MAU (Maternity Admissions Unit), where you are being seen today, will no longer see non-pregnant patients. We strongly encourage you to find a doctor's office before that time, so that you can be seen with any GYN concerns, like vaginal discharge, urinary tract infection, etc.. in a timely manner.  ° °In order to make the office visit more convenient, the [facility] will be offering evening hours from 4pm-7:30pm on Monday. There will be same-day appointments, walk-in appointments and scheduled appointments available during this time. We will be adding more evening hours over the next year before the move.  ° °[facility] - [phone] ° °For urgent needs, [facility] is also available for management of urgent GYN complaints such as vaginal discharge or urinary tract infections.  ° ° ° °Primary care follow up  °[facility] (will see you even if you do not have sickle cell): [phone] °[facility]: [phone] °[facility]: [phone] ° °

## 2016-12-15 NOTE — MAU Note (Signed)
+   lower left abdominal and back pain Sharp and cramping intermittently x5 days Last took 325mg  of tylenol and one hydrocodone this morning she reports LMP 11/29 and has continued; bright red bleeding--no clots +vaginal itching--no discharge Patient endorses a hx of endometriosis and cyst. States she feels like the cyst ruptured.

## 2016-12-22 ENCOUNTER — Ambulatory Visit (HOSPITAL_COMMUNITY): Payer: Self-pay

## 2016-12-25 ENCOUNTER — Ambulatory Visit (HOSPITAL_COMMUNITY): Payer: Self-pay

## 2017-01-01 ENCOUNTER — Ambulatory Visit (HOSPITAL_COMMUNITY): Admission: RE | Admit: 2017-01-01 | Payer: Self-pay | Source: Ambulatory Visit

## 2017-01-22 ENCOUNTER — Encounter: Payer: Self-pay | Admitting: Obstetrics & Gynecology

## 2018-10-19 ENCOUNTER — Other Ambulatory Visit: Payer: Self-pay

## 2018-10-19 ENCOUNTER — Encounter (HOSPITAL_BASED_OUTPATIENT_CLINIC_OR_DEPARTMENT_OTHER): Payer: Self-pay

## 2018-10-19 ENCOUNTER — Emergency Department (HOSPITAL_BASED_OUTPATIENT_CLINIC_OR_DEPARTMENT_OTHER)
Admission: EM | Admit: 2018-10-19 | Discharge: 2018-10-19 | Disposition: A | Discharge status: Home / Self Care | Payer: Medicaid Other | Attending: Emergency Medicine | Admitting: Emergency Medicine

## 2018-10-19 DIAGNOSIS — Z3202 Encounter for pregnancy test, result negative: Secondary | ICD-10-CM | POA: Insufficient documentation

## 2018-10-19 DIAGNOSIS — R1084 Generalized abdominal pain: Secondary | ICD-10-CM | POA: Diagnosis present

## 2018-10-19 DIAGNOSIS — Z87891 Personal history of nicotine dependence: Secondary | ICD-10-CM | POA: Diagnosis not present

## 2018-10-19 DIAGNOSIS — Z79899 Other long term (current) drug therapy: Secondary | ICD-10-CM | POA: Diagnosis not present

## 2018-10-19 DIAGNOSIS — F1193 Opioid use, unspecified with withdrawal: Secondary | ICD-10-CM

## 2018-10-19 DIAGNOSIS — Z91040 Latex allergy status: Secondary | ICD-10-CM | POA: Diagnosis not present

## 2018-10-19 DIAGNOSIS — F1123 Opioid dependence with withdrawal: Secondary | ICD-10-CM | POA: Diagnosis not present

## 2018-10-19 LAB — URINALYSIS, ROUTINE W REFLEX MICROSCOPIC
Bilirubin Urine: NEGATIVE
Glucose, UA: NEGATIVE mg/dL
Ketones, ur: NEGATIVE mg/dL
Leukocytes,Ua: NEGATIVE
Nitrite: NEGATIVE
Protein, ur: NEGATIVE mg/dL
Specific Gravity, Urine: 1.005 (ref 1.005–1.030)
pH: 7 (ref 5.0–8.0)

## 2018-10-19 LAB — URINALYSIS, MICROSCOPIC (REFLEX)
RBC / HPF: >50 RBC/hpf (ref 0–5)
WBC, UA: NONE SEEN WBC/hpf (ref 0–5)

## 2018-10-19 LAB — PREGNANCY, URINE: Preg Test, Ur: NEGATIVE

## 2018-10-19 MED ORDER — HYDROCODONE-ACETAMINOPHEN 5-325 MG PO TABS
2.0000 | ORAL_TABLET | Freq: Once | ORAL | Status: AC
  Administered 2018-10-19: 2 via ORAL
  Filled 2018-10-19: qty 2

## 2018-10-19 MED ORDER — HYDROCODONE-ACETAMINOPHEN 10-325 MG PO TABS: 1.0000 | ORAL_TABLET | Freq: Four times a day (QID) | ORAL | 0 refills | Status: DC | PRN

## 2018-10-19 MED ORDER — ONDANSETRON 8 MG PO TBDP
8.0000 mg | ORAL_TABLET | Freq: Once | ORAL | Status: AC
  Administered 2018-10-19: 8 mg via ORAL
  Filled 2018-10-19: qty 1

## 2018-10-19 NOTE — Discharge Instructions (Addendum)
You were seen in the emergency department for worsening of your abdominal symptoms along with some element of narcotic withdrawal.  Your symptoms improved with restarting your medication.  You will be important for you to contact your prescribing physician to let them know.  Return to the emergency department if any worsening symptoms.

## 2018-10-19 NOTE — ED Notes (Signed)
Assisted to bathroom to provide urine specimen 

## 2018-10-19 NOTE — ED Triage Notes (Signed)
Pt with left flank pain, treated for multiple abdominal complaints, out of pain medications, unable to reach physician, has virtual appoint tomorrow.  Due for refill of medications October 4th.  Takes hydrocodone for pain at home.  Has been out of medications since then.  Nausea, vomiting, diarrhea.

## 2018-10-19 NOTE — ED Provider Notes (Signed)
[place] EMERGENCY DEPARTMENT Provider Note   CSN: [id number] Arrival date & time: 10/19/18  5427     History   Chief Complaint Chief Complaint  Patient presents with  . Abdominal Pain    HPI Melissa Sellers is a 38 y.o. female.  She has multiple chronic abdominal issues including endometriosis interstitial cystitis.  She is on chronic narcotics for this taking hydrocodone 10 mg 4 times a day.  She said she uses to get a prescription filled on the fourth 3 days ago but her doctor did not call it in.  She has not had any medication for a few days and is complaining of crampy abdominal pain nausea vomiting diarrhea blurry vision tremors.  She feels this is related to narcotic withdrawal.  She said she had 1 pill left over that she took yesterday which mitigated her symptoms for a little while.  She is reached out to her doctor and has a virtual appointment tomorrow but has made multiple efforts to try to get pharmacy to contact the doctor for refills.     The history is provided by the patient.  Abdominal Pain Pain location:  Generalized Pain quality: cramping   Pain severity:  Severe Onset quality:  Gradual Timing:  Constant Progression:  Unchanged Chronicity:  Recurrent Context: medication withdrawal   Context: not sick contacts, not suspicious food intake and not trauma   Relieved by:  Nothing Worsened by:  Nothing Ineffective treatments:  OTC medications Associated symptoms: nausea and vomiting   Associated symptoms: no chest pain, no constipation, no cough, no dysuria, no fever, no hematuria, no shortness of breath, no sore throat, no vaginal bleeding and no vaginal discharge     Past Medical History:  Diagnosis Date  . Bladder spasm   . Endometriosis   . Frequency of urination   . Herpes simplex   . Interstitial cystitis   . Migraines   . Nocturia   . Pelvic pain in female   . Urgency of urination     Patient Active Problem List   Diagnosis Date  Noted  . Pelvic pain 12/15/2016  . IC (interstitial cystitis) 07/06/2012  . Endometriosis 07/06/2012    Past Surgical History:  Procedure Laterality Date  . AUGMENTATION MAMMAPLASTY  2009   IMPLANTS  . CYSTO/ HOD/ INSTILLATION THERAPY  AUG 2011  . ECTOPIC PREGNANCY SURGERY  2010   right salpingectomy  . MULTIPLE LAPAROSCOPIES FOR ENDOMETRIOSIS  LAST ONE 2010  . OOPHORECTOMY  2013   LSO  . PELVIC LAPAROSCOPY     DL  LSO     OB History    Gravida  4   Para  1   Term  1   Preterm      AB  3   Living  1     SAB  2   TAB      Ectopic  1   Multiple  0   Live Births               Home Medications    Prior to Admission medications   Medication Sig Start Date End Date Taking? Authorizing Provider  ALPRAZolam Duanne Moron) 1 MG tablet Take 0.5-1 mg by mouth 2 (two) times daily as needed for anxiety.     [provider]  citalopram (CELEXA) 40 MG tablet Take 80 mg by mouth at bedtime.    [provider]  HYDROcodone-acetaminophen (NORCO/VICODIN) 5-325 MG per tablet Take 1-2 tablets by mouth every 4 (  four) hours as needed for moderate pain.    [provider]    Family History Family History  Problem Relation Age of Onset  . Breast cancer Paternal Grandmother     Social History Social History   Tobacco Use  . Smoking status: Former Smoker    Packs/day: 0.15    Years: 8.00    Pack years: 1.20    Types: Cigarettes  . Smokeless tobacco: Never Used  . Tobacco comment: STATES HAS SMOKED A TOTAL OF 8 YRS--  OFF AND ON   Substance Use Topics  . Alcohol use: No  . Drug use: Not on file     Allergies   Amitriptyline, Ciprofloxacin, Codeine, Diflucan [fluconazole], Doxycycline, Penicillins, Shellfish allergy, Sulfa antibiotics, Tramadol, Flagyl [metronidazole hcl], and Latex   Review of Systems Review of Systems  Constitutional: Positive for diaphoresis. Negative for fever.  HENT: Negative for sore throat.   Eyes: Positive for  visual disturbance.  Respiratory: Negative for cough and shortness of breath.   Cardiovascular: Negative for chest pain.  Gastrointestinal: Positive for abdominal pain, nausea and vomiting. Negative for constipation.  Genitourinary: Negative for dysuria, hematuria, vaginal bleeding and vaginal discharge.  Skin: Negative for rash.  Neurological: Positive for dizziness.     Physical Exam Updated Vital Signs BP 121/84   Pulse 80   Temp 98.2 F (36.8 C) (Oral)   Resp 16   Ht 5' (1.524 m)   Wt 54.4 kg   SpO2 99%   BMI 23.44 kg/m   Physical Exam Vitals signs and nursing note reviewed.  Constitutional:      General: She is not in acute distress.    Appearance: She is well-developed.  HENT:     Head: Normocephalic and atraumatic.  Eyes:     Conjunctiva/sclera: Conjunctivae normal.  Neck:     Musculoskeletal: Neck supple.  Cardiovascular:     Rate and Rhythm: Normal rate and regular rhythm.     Heart sounds: No murmur.  Pulmonary:     Effort: Pulmonary effort is normal. No respiratory distress.     Breath sounds: Normal breath sounds.  Abdominal:     Palpations: Abdomen is soft.     Tenderness: There is generalized abdominal tenderness. There is no guarding or rebound.  Musculoskeletal: Normal range of motion.     Right lower leg: No edema.     Left lower leg: No edema.  Skin:    General: Skin is warm and dry.     Capillary Refill: Capillary refill takes less than 2 seconds.  Neurological:     General: No focal deficit present.     Mental Status: She is alert.     Sensory: No sensory deficit.     Motor: No weakness.     Gait: Gait normal.      ED Treatments / Results  Labs (all labs ordered are listed, but only abnormal results are displayed) Labs Reviewed  URINALYSIS, ROUTINE W REFLEX MICROSCOPIC - Abnormal; Notable for the following components:      Result Value   Color, Urine PINK (*)    APPearance HAZY (*)    Hgb urine dipstick LARGE (*)    All other  components within normal limits  URINALYSIS, MICROSCOPIC (REFLEX) - Abnormal; Notable for the following components:   Bacteria, UA RARE (*)    All other components within normal limits  PREGNANCY, URINE    EKG None  Radiology No results found.  Procedures Procedures (including critical care time)  Medications Ordered in ED Medications  HYDROcodone-acetaminophen (NORCO/VICODIN) 5-325 MG per tablet 2 tablet (has no administration in time range)     Initial Impression / Assessment and Plan / ED Course  I have reviewed the triage vital signs and the nursing notes.  Pertinent labs & imaging results that were available during my care of the patient were reviewed by me and considered in my medical decision making (see chart for details).  Clinical Course as of Oct 19 1627  Wed Oct 19, 2018  2225 38 year old female with chronic abdominal issues here with multiple complaints including nausea vomiting diarrhea abdominal pain tremors blurry vision.  She feels all the symptoms are related to medication withdrawal.  She has multiple intolerances to medications and only wants to try her hydrocodone.  I am unsure of sending her home with a prescription would break her pain contract so she is going to sort this out with her doctor.  Check a urine and a pregnancy test.  Currently she does not want further testing but would just like to try medication.   [MB]  1037 Patient urinalysis coming back showing 50 reds.  She says she is having her period now.  She is asking for prescription short-term for pain medication and is reaching out to her IC specialist Dr. who is prescribing her pain medication.   [MB]    Clinical Course User Index [MB] Terrilee Files, MD        Final Clinical Impressions(s) / ED Diagnoses   Final diagnoses:  Generalized abdominal pain  Acute narcotic withdrawal [facility])    ED Discharge Orders         Ordered    HYDROcodone-acetaminophen (NORCO) 10-325 MG tablet   Every 6 hours PRN     10/19/18 1049           Terrilee Files, MD 10/19/18 1629

## 2019-06-19 ENCOUNTER — Encounter (HOSPITAL_COMMUNITY): Payer: Self-pay | Admitting: Emergency Medicine

## 2019-06-19 ENCOUNTER — Emergency Department (HOSPITAL_COMMUNITY)
Admission: EM | Admit: 2019-06-19 | Discharge: 2019-06-19 | Disposition: A | Discharge status: Home / Self Care | Payer: Medicaid Other | Attending: Emergency Medicine | Admitting: Emergency Medicine

## 2019-06-19 ENCOUNTER — Other Ambulatory Visit: Payer: Self-pay

## 2019-06-19 ENCOUNTER — Emergency Department (HOSPITAL_COMMUNITY): Payer: Medicaid Other

## 2019-06-19 DIAGNOSIS — R102 Pelvic and perineal pain: Secondary | ICD-10-CM | POA: Diagnosis not present

## 2019-06-19 DIAGNOSIS — Z87891 Personal history of nicotine dependence: Secondary | ICD-10-CM | POA: Insufficient documentation

## 2019-06-19 DIAGNOSIS — R1031 Right lower quadrant pain: Secondary | ICD-10-CM | POA: Diagnosis present

## 2019-06-19 DIAGNOSIS — Z91040 Latex allergy status: Secondary | ICD-10-CM | POA: Insufficient documentation

## 2019-06-19 DIAGNOSIS — B9689 Other specified bacterial agents as the cause of diseases classified elsewhere: Secondary | ICD-10-CM | POA: Diagnosis not present

## 2019-06-19 DIAGNOSIS — R1032 Left lower quadrant pain: Secondary | ICD-10-CM | POA: Diagnosis not present

## 2019-06-19 DIAGNOSIS — R197 Diarrhea, unspecified: Secondary | ICD-10-CM | POA: Insufficient documentation

## 2019-06-19 DIAGNOSIS — N760 Acute vaginitis: Secondary | ICD-10-CM | POA: Insufficient documentation

## 2019-06-19 LAB — I-STAT BETA HCG BLOOD, ED (MC, WL, AP ONLY): I-stat hCG, quantitative: <5 m[IU]/mL (ref <5)

## 2019-06-19 LAB — CBC WITH DIFFERENTIAL/PLATELET
Abs Immature Granulocytes: 0.01 10*3/uL (ref 0.00–0.07)
Basophils Absolute: 0.1 10*3/uL (ref 0.0–0.1)
Basophils Relative: 1 %
Eosinophils Absolute: 0.1 10*3/uL (ref 0.0–0.5)
Eosinophils Relative: 1 %
HCT: 36.1 % (ref 36.0–46.0)
Hemoglobin: 12 g/dL (ref 12.0–15.0)
Immature Granulocytes: 0 %
Lymphocytes Relative: 31 %
Lymphs Abs: 1.6 10*3/uL (ref 0.7–4.0)
MCH: 31.3 pg (ref 26.0–34.0)
MCHC: 33.2 g/dL (ref 30.0–36.0)
MCV: 94.3 fL (ref 80.0–100.0)
Monocytes Absolute: 0.3 10*3/uL (ref 0.1–1.0)
Monocytes Relative: 6 %
Neutro Abs: 3 10*3/uL (ref 1.7–7.7)
Neutrophils Relative %: 61 %
Platelets: 198 10*3/uL (ref 150–400)
RBC: 3.83 MIL/uL — ABNORMAL LOW (ref 3.87–5.11)
RDW: 13.2 % (ref 11.5–15.5)
WBC: 5 10*3/uL (ref 4.0–10.5)
nRBC: 0 % (ref 0.0–0.2)

## 2019-06-19 LAB — URINALYSIS, ROUTINE W REFLEX MICROSCOPIC
Bilirubin Urine: NEGATIVE
Glucose, UA: NEGATIVE mg/dL
Hgb urine dipstick: NEGATIVE
Ketones, ur: NEGATIVE mg/dL
Leukocytes,Ua: NEGATIVE
Nitrite: NEGATIVE
Protein, ur: NEGATIVE mg/dL
Specific Gravity, Urine: 1.005 (ref 1.005–1.030)
pH: 7 (ref 5.0–8.0)

## 2019-06-19 LAB — BASIC METABOLIC PANEL
Anion gap: 8 (ref 5–15)
BUN: 8 mg/dL (ref 6–20)
CO2: 28 mmol/L (ref 22–32)
Calcium: 8.7 mg/dL — ABNORMAL LOW (ref 8.9–10.3)
Chloride: 100 mmol/L (ref 98–111)
Creatinine, Ser: 0.61 mg/dL (ref 0.44–1.00)
GFR calc Af Amer: >60 mL/min (ref >60)
GFR calc non Af Amer: >60 mL/min (ref >60)
Glucose, Bld: 97 mg/dL (ref 70–99)
Potassium: 3.9 mmol/L (ref 3.5–5.1)
Sodium: 136 mmol/L (ref 135–145)

## 2019-06-19 LAB — GC/CHLAMYDIA PROBE AMP (~~LOC~~) NOT AT ARMC
Chlamydia: NEGATIVE
Comment: NEGATIVE
Comment: NORMAL
Neisseria Gonorrhea: NEGATIVE

## 2019-06-19 LAB — WET PREP, GENITAL
Sperm: NONE SEEN
Trich, Wet Prep: NONE SEEN
Yeast Wet Prep HPF POC: NONE SEEN

## 2019-06-19 MED ORDER — METRONIDAZOLE 0.75 % VA GEL: 1.0000 | Freq: Two times a day (BID) | VAGINAL | 0 refills | Status: DC

## 2019-06-19 MED ORDER — OXYCODONE-ACETAMINOPHEN 5-325 MG PO TABS
1.0000 | ORAL_TABLET | Freq: Once | ORAL | Status: AC
  Administered 2019-06-19: 1 via ORAL
  Filled 2019-06-19: qty 1

## 2019-06-19 MED ORDER — MORPHINE SULFATE (PF) 4 MG/ML IV SOLN
4.0000 mg | Freq: Once | INTRAVENOUS | Status: AC
  Administered 2019-06-19: 4 mg via INTRAVENOUS
  Filled 2019-06-19: qty 1

## 2019-06-19 MED ORDER — SODIUM CHLORIDE 0.9 % IV BOLUS
1000.0000 mL | Freq: Once | INTRAVENOUS | Status: AC
  Administered 2019-06-19: 1000 mL via INTRAVENOUS

## 2019-06-19 NOTE — ED Provider Notes (Signed)
[facility]-EMERGENCY DEPT Provider Note   CSN: [id number] Arrival date & time: 06/19/19  0440     History Chief Complaint  Patient presents with  . Abdominal Pain    lower    Melissa Sellers is a 39 y.o. female with a past medical history of chronic pelvic pain, interstitial cystitis, endometriosis presenting to the ED with a chief complaint of lower abdominal pain and pelvic pain.  She has been suffering from this pain for the past several months.  She takes hydrocodone at home which usually helps with the symptoms.  However over the past day or so her pain has gotten worse.  She reports similar pain with her prior interstitial cystitis flareups.  She states that usually this is amplified with stress, she reports recent stressors related to moving to a new home, having a cockroach infestation.  She has been taking her home pain medication without significant improvement.  She reports some vaginal discharge and is concerned that she may have a yeast infection.  She reports diarrhea, denies any nausea, vomiting, fever, dysuria, hematuria, back pain.  States that she recently tested negative for STDs.  HPI     Past Medical History:  Diagnosis Date  . Bladder spasm   . Endometriosis   . Frequency of urination   . Herpes simplex   . Interstitial cystitis   . Migraines   . Nocturia   . Pelvic pain in female   . Urgency of urination     Patient Active Problem List   Diagnosis Date Noted  . Pelvic pain 12/15/2016  . IC (interstitial cystitis) 07/06/2012  . Endometriosis 07/06/2012    Past Surgical History:  Procedure Laterality Date  . AUGMENTATION MAMMAPLASTY  2009   IMPLANTS  . CYSTO/ HOD/ INSTILLATION THERAPY  AUG 2011  . ECTOPIC PREGNANCY SURGERY  2010   right salpingectomy  . MULTIPLE LAPAROSCOPIES FOR ENDOMETRIOSIS  LAST ONE 2010  . OOPHORECTOMY  2013   LSO  . PELVIC LAPAROSCOPY     DL  LSO     OB History    Gravida  4   Para  1   Term    1   Preterm      AB  3   Living  1     SAB  2   TAB      Ectopic  1   Multiple  0   Live Births              Family History  Problem Relation Age of Onset  . Breast cancer Paternal Grandmother     Social History   Tobacco Use  . Smoking status: Former Smoker    Packs/day: 0.15    Years: 8.00    Pack years: 1.20    Types: Cigarettes  . Smokeless tobacco: Never Used  . Tobacco comment: STATES HAS SMOKED A TOTAL OF 8 YRS--  OFF AND ON   Substance Use Topics  . Alcohol use: No  . Drug use: Not on file    Home Medications Prior to Admission medications   Medication Sig Start Date End Date Taking? Authorizing Provider  ALPRAZolam Prudy Feeler) 1 MG tablet Take 0.5-1 mg by mouth 2 (two) times daily as needed for anxiety.     [provider]  citalopram (CELEXA) 40 MG tablet Take 80 mg by mouth at bedtime.    [provider]  HYDROcodone-acetaminophen (NORCO) 10-325 MG tablet Take 1 tablet by mouth every 6 (six)  hours as needed. 10/19/18   Terrilee Files, MD  metroNIDAZOLE (METROGEL VAGINAL) 0.75 % vaginal gel Place 1 Applicatorful vaginally 2 (two) times daily. 06/19/19   Delvin Hedeen, PA-C    Allergies    Amitriptyline, Ciprofloxacin, Codeine, Diflucan [fluconazole], Doxycycline, Penicillins, Shellfish allergy, Sulfa antibiotics, Tramadol, Flagyl [metronidazole hcl], and Latex  Review of Systems   Review of Systems  Constitutional: Negative for appetite change, chills and fever.  HENT: Negative for ear pain, rhinorrhea, sneezing and sore throat.   Eyes: Negative for photophobia and visual disturbance.  Respiratory: Negative for cough, chest tightness, shortness of breath and wheezing.   Cardiovascular: Negative for chest pain and palpitations.  Gastrointestinal: Positive for abdominal pain and diarrhea. Negative for blood in stool, constipation, nausea and vomiting.  Genitourinary: Positive for pelvic pain and vaginal discharge. Negative for  dysuria, hematuria and urgency.  Musculoskeletal: Negative for myalgias.  Skin: Negative for rash.  Neurological: Negative for dizziness, weakness and light-headedness.    Physical Exam Updated Vital Signs BP 127/84 (BP Location: Left Arm)   Pulse 64   Temp 98 F (36.7 C) (Oral)   Resp 18   Ht 5' (1.524 m)   Wt 58.1 kg   SpO2 100%   BMI 25.00 kg/m   Physical Exam Vitals and nursing note reviewed. Exam conducted with a chaperone present.  Constitutional:      General: She is not in acute distress.    Appearance: She is well-developed.  HENT:     Head: Normocephalic and atraumatic.     Nose: Nose normal.  Eyes:     General: No scleral icterus.       Left eye: No discharge.     Conjunctiva/sclera: Conjunctivae normal.  Cardiovascular:     Rate and Rhythm: Normal rate and regular rhythm.     Heart sounds: Normal heart sounds. No murmur. No friction rub. No gallop.   Pulmonary:     Effort: Pulmonary effort is normal. No respiratory distress.     Breath sounds: Normal breath sounds.  Abdominal:     General: Bowel sounds are normal. There is no distension.     Palpations: Abdomen is soft.     Tenderness: There is abdominal tenderness in the right lower quadrant, suprapubic area and left lower quadrant. There is no guarding.  Genitourinary:    Vagina: Vaginal discharge (thin, clear) present.     Cervix: No cervical motion tenderness.     Uterus: Tender.      Adnexa:        Right: Tenderness present.        Left: Tenderness present.      Comments: Pelvic exam: normal external genitalia without evidence of trauma. VULVA: normal appearing vulva with no masses, tenderness or lesion. VAGINA: normal appearing vagina with normal color and discharge, no lesions. CERVIX: normal appearing cervix without lesions, cervical motion tenderness absent, cervical os closed with out purulent discharge; No vaginal discharge. Wet prep and DNA probe for chlamydia and GC obtained.   ADNEXA:  normal adnexa in size, mild bilateral tenderness noted.  No masses. UTERUS: uterus is normal size, shape, consistency and mildly tender. Musculoskeletal:        General: Normal range of motion.     Cervical back: Normal range of motion and neck supple.  Skin:    General: Skin is warm and dry.     Findings: No rash.  Neurological:     Mental Status: She is alert.     Motor:  No abnormal muscle tone.     Coordination: Coordination normal.     ED Results / Procedures / Treatments   Labs (all labs ordered are listed, but only abnormal results are displayed) Labs Reviewed  WET PREP, GENITAL - Abnormal; Notable for the following components:      Result Value   Clue Cells Wet Prep HPF POC PRESENT (*)    WBC, Wet Prep HPF POC FEW (*)    All other components within normal limits  BASIC METABOLIC PANEL - Abnormal; Notable for the following components:   Calcium 8.7 (*)    All other components within normal limits  CBC WITH DIFFERENTIAL/PLATELET - Abnormal; Notable for the following components:   RBC 3.83 (*)    All other components within normal limits  URINALYSIS, ROUTINE W REFLEX MICROSCOPIC - Abnormal; Notable for the following components:   Color, Urine STRAW (*)    All other components within normal limits  URINE CULTURE  I-STAT BETA HCG BLOOD, ED ([facility], [facility], [facility] ONLY)  GC/CHLAMYDIA PROBE AMP ([place]) NOT AT [facility]    EKG None  Radiology No results found.  Procedures Procedures (including critical care time)  Medications Ordered in ED Medications  oxyCODONE-acetaminophen (PERCOCET/ROXICET) 5-325 MG per tablet 1 tablet (has no administration in time range)  sodium chloride 0.9 % bolus 1,000 mL (1,000 mLs Intravenous New Bag/Given 06/19/19 0527)  morphine 4 MG/ML injection 4 mg (4 mg Intravenous Given 06/19/19 0529)    ED Course  I have reviewed the triage vital signs and the nursing notes.  Pertinent labs & imaging results that were available during my care of the patient  were reviewed by me and considered in my medical decision making (see chart for details).  Clinical Course as of Jun 18 640  Mon Jun 19, 2019  0602 Clue Cells Wet Prep HPF POC(!): PRESENT [HK]    Clinical Course User Index [HK] Delia Heady, PA-C   MDM Rules/Calculators/A&P                      39 year old female with past medical history of endometriosis, interstitial cystitis, chronic pelvic pain presenting to the ED with a chief complaint of abdominal pain pelvic pain.  Symptoms have been ongoing for months but have worsened recently.  No improvement noted with her home hydrocodone.  Reports history of interstitial cystitis flareups with worsening stress and she reports recent increase stressors.  She has tenderness palpation of the lower abdomen diffusely as well as the pelvic area without rebound or guarding.  Pelvic exam shows thin clear vaginal discharge with mild tenderness throughout.  Uterus and adnexa.  Will attempt to control pain here while awaiting lab work, urinalysis and wet prep results. She states she is unable to take NSAIDs as they interact with her Celexa and "make me wind up in the hospital."  Lab work ordered and interpreted, significant for unremarkable BMP, CBC.  Wet prep positive for clue cells. She reports allergy to oral Flagyl but states that she is used MetroGel in the past and is willing to use this to treat BV.  UA without signs of infection or other abnormalities. Will attempt to control pain here with Percocet that she states has helped in the past. If pain still not controlled, consider pelvic u/s to further evaluate. If this is reassuring or her symptoms improve here, anticipate discharge to f/u with specialist and tx for BV. She receives Norco monthly and I informed patient that she  can use this medication to continue to control her pain at home. Patient updated on plan of care. Care handed off to oncoming provider pending disposition.    Portions of this  note were generated with Scientist, clinical (histocompatibility and immunogenetics). Dictation errors may occur despite best attempts at proofreading.  Final Clinical Impression(s) / ED Diagnoses Final diagnoses:  Bacterial vaginosis    Rx / DC Orders ED Discharge Orders         Ordered    metroNIDAZOLE (METROGEL VAGINAL) 0.75 % vaginal gel  2 times daily     06/19/19 0628           Dietrich Pates, PA-C 06/19/19 2992    Nira Conn, MD 06/19/19 [id number]

## 2019-06-19 NOTE — ED Triage Notes (Signed)
Patient c/o lower abdominal pain with nausea, no vomiting. Pt has hx of endometriosis PCOS, IC, and Fibromyalgia. Unable to control pain with home pain med.

## 2019-06-19 NOTE — Discharge Instructions (Addendum)
Use the Metrogel to help with your bacterial vaginosis.   At this time there does not appear to be the presence of an emergent medical condition, however there is always the potential for conditions to change. Please read and follow the below instructions.  Please return to the Emergency Department immediately for any new or worsening symptoms. Please be sure to follow up with your Primary Care Provider within one week regarding your visit today; please call their office to schedule an appointment even if you are feeling better for a follow-up visit. You are given pain medicine in the ER today called Percocet.  Do not drive, drink alcohol or perform any dangerous activities for the rest of the day as Percocet will make you drowsy.  Get help right away if: You have sudden pain that is very bad. Your pain keeps getting worse. You have very bad pain and also have any of these symptoms: A fever. Feeling sick to your stomach (nausea). Throwing up (vomiting). Being very sweaty. You pass out (lose consciousness). You have any new/concerning or worsening of symptoms  Please read the additional information packets attached to your discharge summary.  Do not take your medicine if  develop an itchy rash, swelling in your mouth or lips, or difficulty breathing; call 911 and seek immediate emergency medical attention if this occurs.  Note: Portions of this text may have been transcribed using voice recognition software. Every effort was made to ensure accuracy; however, inadvertent computerized transcription errors may still be present.

## 2019-06-19 NOTE — ED Provider Notes (Addendum)
Care handoff received from [facility] PA-C at shift change.  Please see previous provider note for full details of visit.  Previous provider MDM: "39 year old female with past medical history of endometriosis, interstitial cystitis, chronic pelvic pain presenting to the ED with a chief complaint of abdominal pain pelvic pain.  Symptoms have been ongoing for months but have worsened recently.  No improvement noted with her home hydrocodone.  Reports history of interstitial cystitis flareups with worsening stress and she reports recent increase stressors.  She has tenderness palpation of the lower abdomen diffusely as well as the pelvic area without rebound or guarding.  Pelvic exam shows thin clear vaginal discharge with mild tenderness throughout.  Uterus and adnexa.  Will attempt to control pain here while awaiting lab work, urinalysis and wet prep results. She states she is unable to take NSAIDs as they interact with her Celexa and "make me wind up in the hospital."  Lab work ordered and interpreted, significant for unremarkable BMP, CBC.  Wet prep positive for clue cells. She reports allergy to oral Flagyl but states that she is used MetroGel in the past and is willing to use this to treat BV.  UA without signs of infection or other abnormalities. Will attempt to control pain here with Percocet that she states has helped in the past. If pain still not controlled, consider pelvic u/s to further evaluate. If this is reassuring or her symptoms improve here, anticipate discharge to f/u with specialist and tx for BV. She receives Norco monthly and I informed patient that she can use this medication to continue to control her pain at home. Patient updated on plan of care. Care handed off to oncoming provider pending disposition."  Physical Exam  BP 117/62   Pulse 60   Temp 98 F (36.7 C) (Oral)   Resp 17   Ht 5' (1.524 m)   Wt 58.1 kg   SpO2 100%   BMI 25.00 kg/m   Physical Exam Constitutional:       General: She is not in acute distress.    Appearance: She is well-developed and normal weight. She is not ill-appearing or toxic-appearing.  HENT:     Head: Normocephalic and atraumatic.  Cardiovascular:     Pulses:          Dorsalis pedis pulses are 2+ on the right side and 2+ on the left side.  Abdominal:     General: Abdomen is flat.     Palpations: Abdomen is soft.     Tenderness: There is no abdominal tenderness. There is no guarding or rebound.  Feet:     Right foot:     Protective Sensation: 5 sites tested. 5 sites sensed.     Left foot:     Protective Sensation: 5 sites tested.     Comments: Refused to remove socks. Skin:    General: Skin is warm and dry.  Neurological:     Mental Status: She is alert.     GCS: GCS eye subscore is 4. GCS verbal subscore is 5. GCS motor subscore is 6.  Psychiatric:        Mood and Affect: Mood normal.     ED Course/Procedures   Clinical Course as of Jun 18 816  Mon Jun 19, 2019  0602 Clue Cells Wet Prep HPF POC(!): PRESENT [HK]    Clinical Course User Index [HK] Delia Heady, PA-C    Procedures  MDM  Patient reassessed, she reports improvement of symptoms but  still has ongoing pain. Shared decision making made with patient and she is requesting pelvic ultrasound, order placed. VSS. - Pelvic US w/ Doppler:  IMPRESSION:  Caesarean section scar anterior uterus.    Surgical absence of LEFT ovary.    No acute pelvic sonographic abnormalities.   Patient reassessed she is resting comfortably no acute distress pain improved after second Percocet.  She is requesting discharge.  She has been updated on work-up as above and states understanding.  MetroGel prescribed by previous provider sent to patient's pharmacy.  AVS completed and given to patient.  Patient states understanding of narcotic precautions and has no further questions.  At discharge patient reported that she had intermittent bilateral plantar foot pain when she  first gets up in the morning, she has not had this pain today.  She refused removing her socks for full evaluation of her feet, she does have a strong and equal pedal pulses and good sensation to all toes.  She does not want any further evaluation of her feet.  I encouraged her to discuss this with her primary care provider at follow-up visit.  Suspect patient symptoms today may be attributable to her chronic pain, reassessment of the abdomen is nontender without peritoneal signs doubt appendicitis, cholecystitis, SBO, perforation or other emergent pathologies requiring further work-up.  At this time there does not appear to be any evidence of an acute emergency medical condition and the patient appears stable for discharge with appropriate outpatient follow up. Diagnosis was discussed with patient who verbalizes understanding of care plan and is agreeable to discharge. I have discussed return precautions with patient who verbalizes understanding. Patient encouraged to follow-up with their PCP. All questions answered.  Note: Portions of this report may have been transcribed using voice recognition software. Every effort was made to ensure accuracy; however, inadvertent computerized transcription errors may still be present.     Bill Salinas, PA-C 06/19/19 1201    Bill Salinas, PA-C 06/19/19 1207    Linwood Dibbles, MD 06/20/19 [phone]

## 2019-06-20 LAB — URINE CULTURE: Culture: <10000 — AB

## 2019-06-28 ENCOUNTER — Emergency Department (HOSPITAL_COMMUNITY)
Admission: EM | Admit: 2019-06-28 | Discharge: 2019-06-28 | Disposition: A | Discharge status: Home / Self Care | Payer: Medicaid Other | Attending: Emergency Medicine | Admitting: Emergency Medicine

## 2019-06-28 ENCOUNTER — Encounter (HOSPITAL_COMMUNITY): Payer: Self-pay

## 2019-06-28 DIAGNOSIS — F1193 Opioid use, unspecified with withdrawal: Secondary | ICD-10-CM

## 2019-06-28 DIAGNOSIS — Z91040 Latex allergy status: Secondary | ICD-10-CM | POA: Insufficient documentation

## 2019-06-28 DIAGNOSIS — Z880 Allergy status to penicillin: Secondary | ICD-10-CM | POA: Diagnosis not present

## 2019-06-28 DIAGNOSIS — R45851 Suicidal ideations: Secondary | ICD-10-CM | POA: Insufficient documentation

## 2019-06-28 DIAGNOSIS — R109 Unspecified abdominal pain: Secondary | ICD-10-CM | POA: Diagnosis present

## 2019-06-28 DIAGNOSIS — R1084 Generalized abdominal pain: Secondary | ICD-10-CM | POA: Diagnosis not present

## 2019-06-28 DIAGNOSIS — Z803 Family history of malignant neoplasm of breast: Secondary | ICD-10-CM | POA: Diagnosis not present

## 2019-06-28 DIAGNOSIS — Z20822 Contact with and (suspected) exposure to covid-19: Secondary | ICD-10-CM | POA: Insufficient documentation

## 2019-06-28 DIAGNOSIS — Z87891 Personal history of nicotine dependence: Secondary | ICD-10-CM | POA: Diagnosis not present

## 2019-06-28 LAB — COMPREHENSIVE METABOLIC PANEL
ALT: 12 U/L (ref 0–44)
AST: 15 U/L (ref 15–41)
Albumin: 4.4 g/dL (ref 3.5–5.0)
Alkaline Phosphatase: 53 U/L (ref 38–126)
Anion gap: 10 (ref 5–15)
BUN: 11 mg/dL (ref 6–20)
CO2: 24 mmol/L (ref 22–32)
Calcium: 9.3 mg/dL (ref 8.9–10.3)
Chloride: 101 mmol/L (ref 98–111)
Creatinine, Ser: 0.53 mg/dL (ref 0.44–1.00)
GFR calc Af Amer: >60 mL/min (ref >60)
GFR calc non Af Amer: >60 mL/min (ref >60)
Glucose, Bld: 95 mg/dL (ref 70–99)
Potassium: 4.1 mmol/L (ref 3.5–5.1)
Sodium: 135 mmol/L (ref 135–145)
Total Bilirubin: 0.6 mg/dL (ref 0.3–1.2)
Total Protein: 8.4 g/dL — ABNORMAL HIGH (ref 6.5–8.1)

## 2019-06-28 LAB — I-STAT BETA HCG BLOOD, ED (MC, WL, AP ONLY): I-stat hCG, quantitative: <5 m[IU]/mL (ref <5)

## 2019-06-28 LAB — CBC
HCT: 39.4 % (ref 36.0–46.0)
Hemoglobin: 13 g/dL (ref 12.0–15.0)
MCH: 30.7 pg (ref 26.0–34.0)
MCHC: 33 g/dL (ref 30.0–36.0)
MCV: 92.9 fL (ref 80.0–100.0)
Platelets: 252 10*3/uL (ref 150–400)
RBC: 4.24 MIL/uL (ref 3.87–5.11)
RDW: 13.2 % (ref 11.5–15.5)
WBC: 6.4 10*3/uL (ref 4.0–10.5)
nRBC: 0 % (ref 0.0–0.2)

## 2019-06-28 LAB — TSH: TSH: 0.556 u[IU]/mL (ref 0.350–4.500)

## 2019-06-28 LAB — RAPID URINE DRUG SCREEN, HOSP PERFORMED
Amphetamines: NOT DETECTED
Barbiturates: NOT DETECTED
Benzodiazepines: POSITIVE — AB
Cocaine: NOT DETECTED
Opiates: POSITIVE — AB
Tetrahydrocannabinol: NOT DETECTED

## 2019-06-28 LAB — TROPONIN I (HIGH SENSITIVITY)
Troponin I (High Sensitivity): 2 ng/L (ref <18)
Troponin I (High Sensitivity): <2 ng/L (ref <18)

## 2019-06-28 LAB — URINALYSIS, ROUTINE W REFLEX MICROSCOPIC
Bilirubin Urine: NEGATIVE
Glucose, UA: NEGATIVE mg/dL
Ketones, ur: 5 mg/dL — AB
Leukocytes,Ua: NEGATIVE
Nitrite: NEGATIVE
Protein, ur: NEGATIVE mg/dL
Specific Gravity, Urine: 1.008 (ref 1.005–1.030)
pH: 9 — ABNORMAL HIGH (ref 5.0–8.0)

## 2019-06-28 LAB — ETHANOL: Alcohol, Ethyl (B): <10 mg/dL (ref <10)

## 2019-06-28 LAB — ACETAMINOPHEN LEVEL: Acetaminophen (Tylenol), Serum: 12 ug/mL (ref 10–30)

## 2019-06-28 LAB — SALICYLATE LEVEL: Salicylate Lvl: <7 mg/dL — ABNORMAL LOW (ref 7.0–30.0)

## 2019-06-28 LAB — LIPASE, BLOOD: Lipase: 29 U/L (ref 11–51)

## 2019-06-28 LAB — SARS CORONAVIRUS 2 BY RT PCR (HOSPITAL ORDER, PERFORMED IN ~~LOC~~ HOSPITAL LAB): SARS Coronavirus 2: NEGATIVE

## 2019-06-28 LAB — MAGNESIUM: Magnesium: 2.2 mg/dL (ref 1.7–2.4)

## 2019-06-28 MED ORDER — METHOCARBAMOL 500 MG PO TABS
500.0000 mg | ORAL_TABLET | Freq: Three times a day (TID) | ORAL | Status: DC | PRN
  Administered 2019-06-28: 500 mg via ORAL
  Filled 2019-06-28: qty 1

## 2019-06-28 MED ORDER — NAPROXEN 500 MG PO TABS
500.0000 mg | ORAL_TABLET | Freq: Two times a day (BID) | ORAL | Status: DC | PRN
  Administered 2019-06-28: 500 mg via ORAL
  Filled 2019-06-28: qty 1

## 2019-06-28 MED ORDER — HYDROXYZINE HCL 25 MG PO TABS: 25.0000 mg | ORAL_TABLET | Freq: Four times a day (QID) | ORAL | Status: DC | PRN

## 2019-06-28 MED ORDER — CLONIDINE HCL 0.1 MG PO TABS
0.1000 mg | ORAL_TABLET | Freq: Four times a day (QID) | ORAL | Status: DC
  Administered 2019-06-28 (×2): 0.1 mg via ORAL
  Filled 2019-06-28 (×2): qty 1

## 2019-06-28 MED ORDER — ALPRAZOLAM 0.5 MG PO TABS: 0.5000 mg | ORAL_TABLET | Freq: Two times a day (BID) | ORAL | Status: DC | PRN

## 2019-06-28 MED ORDER — HYDROCODONE-ACETAMINOPHEN 10-325 MG PO TABS: 1.0000 | ORAL_TABLET | Freq: Four times a day (QID) | ORAL | 0 refills | Status: DC | PRN

## 2019-06-28 MED ORDER — CITALOPRAM HYDROBROMIDE 10 MG PO TABS: 80.0000 mg | ORAL_TABLET | Freq: Every day | ORAL | Status: DC

## 2019-06-28 MED ORDER — LOPERAMIDE HCL 2 MG PO CAPS: 2.0000 mg | ORAL_CAPSULE | ORAL | Status: DC | PRN

## 2019-06-28 MED ORDER — DICYCLOMINE HCL 20 MG PO TABS: 20.0000 mg | ORAL_TABLET | Freq: Four times a day (QID) | ORAL | Status: DC | PRN

## 2019-06-28 MED ORDER — SODIUM CHLORIDE 0.9% FLUSH: 3.0000 mL | Freq: Once | INTRAVENOUS | Status: DC

## 2019-06-28 MED ORDER — CLONIDINE HCL 0.1 MG PO TABS: 0.1000 mg | ORAL_TABLET | Freq: Two times a day (BID) | ORAL | Status: DC

## 2019-06-28 MED ORDER — CLONIDINE HCL 0.1 MG PO TABS: 0.1000 mg | ORAL_TABLET | Freq: Every day | ORAL | Status: DC

## 2019-06-28 MED ORDER — ONDANSETRON 4 MG PO TBDP: 4.0000 mg | ORAL_TABLET | Freq: Four times a day (QID) | ORAL | Status: DC | PRN

## 2019-06-28 MED ORDER — HYDROCODONE-ACETAMINOPHEN 5-325 MG PO TABS
2.0000 | ORAL_TABLET | Freq: Four times a day (QID) | ORAL | Status: DC | PRN
  Administered 2019-06-28: 2 via ORAL
  Filled 2019-06-28: qty 2

## 2019-06-28 NOTE — ED Provider Notes (Signed)
Emergency Department Provider Note   I have reviewed the triage vital signs and the nursing notes.   HISTORY  Chief Complaint Suicidal and Abdominal Pain   HPI Melissa Sellers is a 39 y.o. female with past medical history reviewed below including chronic pain on hydrocodone presents to the emergency department with abdominal pain with nausea, vomiting, diarrhea.  She is reporting some lightheadedness.  She tells me that she has since run out of her 10/325 hydrocodone at home.  She has had worsening pain and tells me that her urologist has refused to write her for stronger pain medicine.  She tells me that she has been trying to treat her pain by taking the Hydrocone more frequently but takes 1 pill at a time every 4 hours as opposed to every 6-8 hours. She has also been taking Xanax frequently. She has taken 7 doses since 8PM yesterday spread out through the night. She denies self harm intention with taking these meds but tells me that she is feeling increasingly hopeless and developed suicidal thinking this AM. She thought about killing herself rather than coming to the ED today. She developed a plan to drive her car into something but tells me she thought of her 56 yo child and decided to call 911.   Medically her abdominal pain is similar to her chronic pain. She attributes her vomiting and diarrhea to opiate withdrawal and notes some palpitations last night without pain or SOB. No fever or chills.    Past Medical History:  Diagnosis Date  . Bladder spasm   . Endometriosis   . Frequency of urination   . Herpes simplex   . Interstitial cystitis   . Migraines   . Nocturia   . Pelvic pain in female   . Urgency of urination     Patient Active Problem List   Diagnosis Date Noted  . Pelvic pain 12/15/2016  . IC (interstitial cystitis) 07/06/2012  . Endometriosis 07/06/2012    Past Surgical History:  Procedure Laterality Date  . AUGMENTATION MAMMAPLASTY  2009   IMPLANTS  .  CYSTO/ HOD/ INSTILLATION THERAPY  AUG 2011  . ECTOPIC PREGNANCY SURGERY  2010   right salpingectomy  . MULTIPLE LAPAROSCOPIES FOR ENDOMETRIOSIS  LAST ONE 2010  . OOPHORECTOMY  2013   LSO  . PELVIC LAPAROSCOPY     DL  LSO    Allergies Amitriptyline, Ciprofloxacin, Codeine, Diflucan [fluconazole], Doxycycline, Penicillins, Shellfish allergy, Sulfa antibiotics, Tramadol, Flagyl [metronidazole hcl], and Latex  Family History  Problem Relation Age of Onset  . Breast cancer Paternal Grandmother     Social History Social History   Tobacco Use  . Smoking status: Former Smoker    Packs/day: 0.15    Years: 8.00    Pack years: 1.20    Types: Cigarettes  . Smokeless tobacco: Never Used  . Tobacco comment: STATES HAS SMOKED A TOTAL OF 8 YRS--  OFF AND ON   Substance Use Topics  . Alcohol use: No  . Drug use: Not on file    Review of Systems  Constitutional: No fever/chills Eyes: No visual changes. ENT: No sore throat. Cardiovascular: Denies chest pain. Respiratory: Denies shortness of breath. Gastrointestinal: Positive abdominal pain. Positive nausea, vomiting, and diarrhea.  No constipation. Genitourinary: Negative for dysuria. Musculoskeletal: Negative for back pain. Skin: Negative for rash. Neurological: Negative for headaches, focal weakness or numbness. Psychiatric: Positive SI with plan.   10-point ROS otherwise negative.  ____________________________________________   PHYSICAL EXAM:  VITAL SIGNS:  ED Triage Vitals  Enc Vitals Group     BP 06/28/19 1047 123/77     Pulse Rate 06/28/19 1047 94     Resp 06/28/19 1047 16     Temp 06/28/19 1047 98.3 F (36.8 C)     Temp Source 06/28/19 1047 Oral     SpO2 06/28/19 1047 100 %   Constitutional: Alert and oriented. Well appearing and in no acute distress. Eyes: Conjunctivae are normal.  Head: Atraumatic. Nose: No congestion/rhinnorhea. Mouth/Throat: Mucous membranes are moist.  Neck: No stridor.  Cardiovascular:  Normal rate, regular rhythm. Good peripheral circulation. Grossly normal heart sounds.   Respiratory: Normal respiratory effort.  No retractions. Lungs CTAB. Gastrointestinal: Soft with mild left sided tenderness. No rebound or guarding. No distention.  Musculoskeletal: No gross deformities of extremities. Neurologic:  Normal speech and language.  Skin:  Skin is warm, dry and intact. No rash noted.   ____________________________________________   LABS (all labs ordered are listed, but only abnormal results are displayed)  Labs Reviewed  COMPREHENSIVE METABOLIC PANEL - Abnormal; Notable for the following components:      Result Value   Total Protein 8.4 (*)    All other components within normal limits  URINALYSIS, ROUTINE W REFLEX MICROSCOPIC - Abnormal; Notable for the following components:   Color, Urine STRAW (*)    pH 9.0 (*)    Hgb urine dipstick LARGE (*)    Ketones, ur 5 (*)    Bacteria, UA RARE (*)    All other components within normal limits  SALICYLATE LEVEL - Abnormal; Notable for the following components:   Salicylate Lvl <7.0 (*)    All other components within normal limits  RAPID URINE DRUG SCREEN, HOSP PERFORMED - Abnormal; Notable for the following components:   Opiates POSITIVE (*)    Benzodiazepines POSITIVE (*)    All other components within normal limits  SARS CORONAVIRUS 2 BY RT PCR (HOSPITAL ORDER, PERFORMED IN [facility])  LIPASE, BLOOD  CBC  MAGNESIUM  TSH  ACETAMINOPHEN LEVEL  ETHANOL  I-STAT BETA HCG BLOOD, ED ([facility], [facility], [facility] ONLY)  TROPONIN I (HIGH SENSITIVITY)  TROPONIN I (HIGH SENSITIVITY)   ____________________________________________  EKG   EKG Interpretation  Date/Time:  Wednesday June 28 2019 10:54:49 EDT Ventricular Rate:  88 PR Interval:    QRS Duration: 77 QT Interval:  368 QTC Calculation: 446 R Axis:   65 Text Interpretation: Sinus rhythm T wave inversions new from 2018 No STEMI Confirmed by Alona Bene [phone]) on  06/28/2019 11:33:49 AM      ____________________________________________   PROCEDURES  Procedure(s) performed:   Procedures  None  ____________________________________________   INITIAL IMPRESSION / ASSESSMENT AND PLAN / ED COURSE  Pertinent labs & imaging results that were available during my care of the patient were reviewed by me and considered in my medical decision making (see chart for details).   Patient presents to the ED with chronic abdominal pain and SI with plan to crash her car. She is feeling increasingly depressed regarding her pain meds at home. Discussed that I will not be increasing her home med dosing and cannot give pain meds now since she admits to taking them inappropriately in addition to taking Xanax. Will need u-tox, labs, and acetaminophen level. Discussed with with SI and plan I am concerned about her and that she needs to been seen by TTS ultimately. Have filed IVC with increased flight risk and SI. Discussed my plan with patient including IVC.  EKG interpreted by me with lateral and inferior T wave inversions. No CP or other anginal equivalent. Have added troponin.   Troponin < 2. Labs reviewed. Patient is medically clear. Have re-ordered home medications including Hydrocodone. IVC paperwork filed.  ____________________________________________  FINAL CLINICAL IMPRESSION(S) / ED DIAGNOSES  Final diagnoses:  Suicidal ideation  Generalized abdominal pain     MEDICATIONS GIVEN DURING THIS VISIT:  Medications  sodium chloride flush (NS) 0.9 % injection 3 mL (has no administration in time range)  cloNIDine (CATAPRES) tablet 0.1 mg (0.1 mg Oral Given 06/28/19 1232)    Followed by  cloNIDine (CATAPRES) tablet 0.1 mg (has no administration in time range)    Followed by  cloNIDine (CATAPRES) tablet 0.1 mg (has no administration in time range)  dicyclomine (BENTYL) tablet 20 mg (has no administration in time range)  hydrOXYzine (ATARAX/VISTARIL) tablet 25 mg  (has no administration in time range)  loperamide (IMODIUM) capsule 2-4 mg (has no administration in time range)  methocarbamol (ROBAXIN) tablet 500 mg (500 mg Oral Given 06/28/19 1233)  ondansetron (ZOFRAN-ODT) disintegrating tablet 4 mg (has no administration in time range)  HYDROcodone-acetaminophen (NORCO/VICODIN) 5-325 MG per tablet 2 tablet (has no administration in time range)  ALPRAZolam (XANAX) tablet 0.5-1 mg (has no administration in time range)  citalopram (CELEXA) tablet 80 mg (has no administration in time range)    Note:  This document was prepared using Dragon voice recognition software and may include unintentional dictation errors.  Nanda Quinton, MD, [facility] Emergency [facility]   Tamala Manzer, [facility], MD 06/28/19 [phone]

## 2019-06-28 NOTE — Discharge Instructions (Addendum)
Take hydrocodone for pain. If you run out early, you will need to talk to your doctor   See your doctor for follow up   See counselor at [facility]   Return to ER if you have worse abdominal pain, thoughts of harming yourself or others

## 2019-06-28 NOTE — BH Assessment (Signed)
Comprehensive Clinical Assessment (CCA) Note  06/28/2019 Melissa Sellers [id number]  Melissa Sellers is a 39 yo female who presents voluntarily to [facility] with c/o pain after running out of her 10/325 hydrocodone at home. Per EDP, pt states she has been trying to treat her pain by taking the Hydrocone more frequently, taking 1 pill at a time every 4 hours as opposed to every 6-8 hours. She has also been taking Xanax frequently. Pt took 7 doses since 8 PM yesterday spread out through the night. Pt denies self harm intention with taking these meds but admits telling MD she had suicidal thoughts this AM. Pt states "I was stupid, I said I was suicidal due to not wanting horrible withdrawal pain". She states she has had SI thoughts at times, related to her pain & recent experience of a really hard month this past month (including a move to an apt she doesn't like).  Pt states she would not harm herself due to care & concern for her 41 yo dtr, who pt states "is my angel". Pt reports no hx of psychiatric tx. Pt denies past suicide attempts. Pt acknowledges multiple symptoms of Depression, including anhedonia, isolating, feelings of worthlessness & guilt, tearfulness, changes in sleep & appetite, & increased irritability. Pt denies homicidal ideation/ history of violence. Pt denies auditory & visual hallucinations & other symptoms of psychosis. Pt states current stressors include recent move. She states she was recently approved for medicaid and is expecting Disability approval.   Pt lives with her dtr, and supports include dtr and mother, June Stanley. Pt reports hx of childhood trauma & PTSD. Pt reports there is a family history of substance abuse- her father. Pt has partial insight and judgment. [name] memory is intact. Legal history includes no charges.  Protective factors against suicide include good family support, no current suicidal ideation, future orientation, no access to firearms, no current psychotic  symptoms and no prior attempts.?  Pt denies alcohol/ substance abuse.  Pt gave verbal authorization to speak with her mother, June Stanley. By phone, Ms. Duffy Rhody reports she has heard pt make suicidal statements- related to her physical pain. Mother knows of no past attempts and states she is not concerned for pt's safety from self-harm. ? MSE: Pt is dressed in scrubs, alert, oriented x4 with normal speech and restless motor behavior. Eye contact is good. [name] mood is depressed and affect is constricted. Affect is congruent with mood. Thought process is coherent and relevant. There is no indication pt is currently responding to internal stimuli or experiencing delusional thought content. Pt was cooperative throughout assessment.   Disposition: Reola Calkins, NP recommends psychiatric clearance. Pt states she is interested in following up with outpt psychiatric counseling after she has hysterectomy surgery.  Diagnosis: MDD, recurrent, severe without sx of psychosis   Visit Diagnosis:      ICD-10-CM   1. Suicidal ideation  R45.851   2. Generalized abdominal pain  R10.84       CCA Screening, Triage and Referral (STR)  Patient Reported Information How did you hear about [place]? Other (Comment) (ED MD)  Referral name: [facility] EDP  Referral phone number: No data recorded  Whom do you see for routine medical problems? Other (Comment) (MD at [facility]- Non-invasive sx; Dr. Logan Bores- Urologist- [place])  Practice/Facility Name: No data recorded Practice/Facility Phone Number: No data recorded Name of Contact: No data recorded Contact Number: No data recorded Contact Fax Number: No data recorded Prescriber Name: No data recorded Prescriber Address (if  known): No data recorded  What Is the Reason for Your Visit/Call Today? for pain but has not helped- "they are witholding pain meds"  How Long Has This Been Causing You Problems? > than 6 months  What Do You Feel Would Help You the Most Today? Other  (Comment) ("I've done therapy" "I don't think BH can help")   Have You Recently Been in Any Inpatient Treatment (Hospital/Detox/Crisis Center/28-Day Program)? No  Name/Location of Program/Hospital:No data recorded How Long Were You There? No data recorded When Were You Discharged? No data recorded  Have You Ever Received Services From [facility] Before? Yes  Who Do You See at [facility]? ED only   Have You Recently Had Any Thoughts About Hurting Yourself? Yes (pt states has had thoughts since 39 yo "but my dtr grounds me")  Are You Planning to Commit Suicide/Harm Yourself At This time? No (denies recently; wants to prevent going through w/d though)   Have you Recently Had Thoughts About Hurting Someone Karolee Ohs? No  Explanation: No data recorded  Have You Used Any Alcohol or Drugs in the Past 24 Hours? No  How Long Ago Did You Use Drugs or Alcohol? No data recorded What Did You Use and How Much? No data recorded  Do You Currently Have a Therapist/Psychiatrist? No (Not any more- stopped 12 years ago)  Name of Therapist/Psychiatrist: No data recorded  Have You Been Recently Discharged From Any Office Practice or Programs? No  Explanation of Discharge From Practice/Program: No data recorded    CCA Screening Triage Referral Assessment Type of Contact: Tele-Assessment  Is this Initial or Reassessment? No data recorded Date Telepsych consult ordered in [facility]:  No data recorded Time Telepsych consult ordered in [facility]:  No data recorded  Patient Reported Information Reviewed? Yes  Patient Left Without Being Seen? No data recorded Reason for Not Completing Assessment: No data recorded  Collateral Involvement: mother, June Stanley [phone]- pt give permission for collateral   Does Patient Have a Court Appointed Legal Guardian? No data recorded Name and Contact of Legal Guardian: No data recorded If Minor and Not Living with Parent(s), Who has Custody? No data recorded Is  CPS involved or ever been involved? Never  Is APS involved or ever been involved? Never   Patient Determined To Be At Risk for Harm To Self or Others Based on Review of Patient Reported Information or Presenting Complaint? Yes, for Self-Harm  Method: No data recorded Availability of Means: No data recorded Intent: No data recorded Notification Required: No data recorded Additional Information for Danger to Others Potential: No data recorded Additional Comments for Danger to Others Potential: No data recorded Are There Guns or Other Weapons in Your Home? No data recorded Types of Guns/Weapons: No data recorded Are These Weapons Safely Secured?                            No data recorded Who Could Verify You Are Able To Have These Secured: No data recorded Do You Have any Outstanding Charges, Pending Court Dates, Parole/Probation? No data recorded Contacted To Inform of Risk of Harm To Self or Others: No data recorded  Location of Assessment: [facility]   Does Patient Present under Involuntary Commitment? No  IVC Papers Initial File Date: No data recorded  [place] of Residence: [place]   Patient Currently Receiving the Following Services: No data recorded  Determination of Need: Urgent (48 hours)   Options For  Referral: Outpatient Therapy;Medication Management     CCA Biopsychosocial  Intake/Chief Complaint:  CCA Intake With Chief Complaint Individual's Strengths: "my dtr", mother's support, perseverance  Mental Health Symptoms Depression:  Depression: Change in energy/activity, Difficulty Concentrating, Fatigue, Hopelessness, Increase/decrease in appetite, Irritability, Sleep (too much or little), Tearfulness, Duration of symptoms greater than two weeks, Worthlessness  Mania:     Anxiety:   Anxiety: Fatigue, Difficulty concentrating, Irritability, Sleep, Restlessness, Tension, Worrying  Psychosis:  Psychosis: None  Trauma:  Trauma: Avoids reminders of event, Detachment  from others, Difficulty staying/falling asleep, Emotional numbing, Guilt/shame, Irritability/anger  Obsessions:  Obsessions: None  Compulsions:  Compulsions: None  Inattention:  Inattention: N/A  Hyperactivity/Impulsivity:  Hyperactivity/Impulsivity: N/A  Oppositional/Defiant Behaviors:  Oppositional/Defiant Behaviors: None  Emotional Irregularity:  Emotional Irregularity: None  Other Mood/Personality Symptoms:      Mental Status Exam Appearance and self-care  Stature:  Stature: Average  Weight:  Weight: Average weight  Clothing:     Grooming:  Grooming: Normal  Cosmetic use:  Cosmetic Use: None  Posture/gait:  Posture/Gait: Tense  Motor activity:  Motor Activity: Restless  Sensorium  Attention:  Attention: Normal  Concentration:  Concentration: Normal  Orientation:  Orientation: X5  Recall/memory:  Recall/Memory: Normal  Affect and Mood  Affect:  Affect: Anxious, Depressed, Constricted  Mood:  Mood: Depressed  Relating  Eye contact:  Eye Contact: Normal  Facial expression:     Attitude toward examiner:     Thought and Language  Speech flow:    Thought content:     Preoccupation:     Hallucinations:  Hallucinations: None  Organization:     Transport planner of Knowledge:  Fund of Knowledge: Good  Intelligence:  Intelligence: Average  Abstraction:     Judgement:     Art therapist:     Insight:     Decision Making:     Social Functioning  Social Maturity:  Social Maturity: Isolates  Social Judgement:  Social Judgement: Normal  Stress  Stressors:  Stressors: Grief/losses, Housing, Illness, Museum/gallery curator, Relationship  Coping Ability:     Skill Deficits:     Supports:        Religion: Religion/Spirituality Are You A Religious Person?: No How Might This Affect Treatment?: "used to be" angry at God for a while  Leisure/Recreation: Leisure / Recreation Do You Have Hobbies?: Yes Leisure and Hobbies: plays games with dtr; 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  Exercise/Diet: Exercise/Diet Do You Exercise?: No ("I can't" due to medical issues) Have You Gained or Lost A Significant Amount of Weight in the Past Six Months?: No Do You Follow a Special Diet?: No Do You Have Any Trouble Sleeping?: Yes Explanation of Sleeping Difficulties: "too much and too little"   CCA Employment/Education  Employment/Work Situation: Employment / Work Situation Employment situation: Unemployed Patient's job has been impacted by current illness: Yes Describe how patient's job has been impacted: chronic pain and low energy What is the longest time patient has a held a job?: 5 years Has patient ever been in the [employer]?: No  Education: Education Is Patient Currently Attending School?: No Did Teacher, adult education From [employer]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   CCA Family/Childhood History  Family and Relationship History:    Childhood History:     Child/Adolescent Assessment:     CCA Substance Use  Alcohol/Drug Use: Alcohol / Drug Use Pain Medications: hydrocodone 10mg  Prescriptions: xanax for anxiety; celexa for depression (does not like celexa) History of alcohol / drug use?: No history of  alcohol / drug abuse                         ASAM's:  Six Dimensions of Multidimensional Assessment  Dimension 1:  Acute Intoxication and/or Withdrawal Potential:      Dimension 2:  Biomedical Conditions and Complications:      Dimension 3:  Emotional, Behavioral, or Cognitive Conditions and Complications:     Dimension 4:  Readiness to Change:     Dimension 5:  Relapse, Continued use, or Continued Problem Potential:     Dimension 6:  Recovery/Living Environment:     ASAM Severity Score:    ASAM Recommended Level of Treatment:     Substance use Disorder (SUD) Substance Use Disorder (SUD)  Checklist Symptoms of Substance Use: Evidence of withdrawal (Comment), Substance(s) often taken in larger amounts or over longer times than was intended  Recommendations  for Services/Supports/Treatments:    DSM5 Diagnoses: Patient Active Problem List   Diagnosis Date Noted   Pelvic pain 12/15/2016   IC (interstitial cystitis) 07/06/2012   Endometriosis 07/06/2012    Patient Centered Plan: Patient is on the following Treatment Plan(s):  Anxiety and Depression   Referrals to Alternative Service(s): Referred to Alternative Service(s):   Place:   Date:   Time:    Referred to Alternative Service(s):   Place:   Date:   Time:    Referred to Alternative Service(s):   Place:   Date:   Time:    Referred to Alternative Service(s):   Place:   Date:   Time:     Treasure Ingrum Suzan Nailer

## 2019-06-28 NOTE — ED Triage Notes (Signed)
Patient arrived via [facility] from home.   C/O abdominal,. N/v, and diarrhea  C/O lightheadedness  C/O SI thoughts   Pt takes hydrocodone for pain but is out of prescription since yesterday.Patient took phenergan this morning.   A/Ox4 Ambulatory with ems.

## 2019-06-28 NOTE — ED Provider Notes (Signed)
  Physical Exam  BP 118/87   Pulse 72   Temp 98.6 F (37 C) (Oral)   Resp 16   LMP 06/27/2019   SpO2 96%   Physical Exam  ED Course/Procedures     Procedures  MDM  Patient was seen by psychiatry was cleared.  Patient is actually here because she has opiate withdrawal.  She ran out of her hydrocodone a little early.  Will only give several pills of hydrocodone and she is due for refill in 4 days.        Charlynne Pander, MD 06/28/19 [phone]

## 2021-02-05 ENCOUNTER — Emergency Department (HOSPITAL_COMMUNITY)
Admission: EM | Admit: 2021-02-05 | Discharge: 2021-02-05 | Disposition: A | Discharge status: Home / Self Care | Payer: Medicaid Other | Attending: Emergency Medicine | Admitting: Emergency Medicine

## 2021-02-05 DIAGNOSIS — R1032 Left lower quadrant pain: Secondary | ICD-10-CM | POA: Diagnosis not present

## 2021-02-05 DIAGNOSIS — R519 Headache, unspecified: Secondary | ICD-10-CM | POA: Insufficient documentation

## 2021-02-05 DIAGNOSIS — N939 Abnormal uterine and vaginal bleeding, unspecified: Secondary | ICD-10-CM | POA: Diagnosis present

## 2021-02-05 DIAGNOSIS — R42 Dizziness and giddiness: Secondary | ICD-10-CM | POA: Diagnosis not present

## 2021-02-05 DIAGNOSIS — Z5321 Procedure and treatment not carried out due to patient leaving prior to being seen by health care provider: Secondary | ICD-10-CM | POA: Insufficient documentation

## 2021-02-05 LAB — URINALYSIS, ROUTINE W REFLEX MICROSCOPIC
Bilirubin Urine: NEGATIVE
Glucose, UA: NEGATIVE mg/dL
Ketones, ur: 5 mg/dL — AB
Leukocytes,Ua: NEGATIVE
Nitrite: NEGATIVE
Protein, ur: NEGATIVE mg/dL
RBC / HPF: >50 RBC/hpf — ABNORMAL HIGH (ref 0–5)
Specific Gravity, Urine: 1.011 (ref 1.005–1.030)
pH: 6 (ref 5.0–8.0)

## 2021-02-05 LAB — COMPREHENSIVE METABOLIC PANEL
ALT: 17 U/L (ref 0–44)
AST: 22 U/L (ref 15–41)
Albumin: 4 g/dL (ref 3.5–5.0)
Alkaline Phosphatase: 51 U/L (ref 38–126)
Anion gap: 10 (ref 5–15)
BUN: 8 mg/dL (ref 6–20)
CO2: 24 mmol/L (ref 22–32)
Calcium: 9.1 mg/dL (ref 8.9–10.3)
Chloride: 98 mmol/L (ref 98–111)
Creatinine, Ser: 0.73 mg/dL (ref 0.44–1.00)
GFR, Estimated: >60 mL/min (ref >60)
Glucose, Bld: 94 mg/dL (ref 70–99)
Potassium: 4.7 mmol/L (ref 3.5–5.1)
Sodium: 132 mmol/L — ABNORMAL LOW (ref 135–145)
Total Bilirubin: 0.6 mg/dL (ref 0.3–1.2)
Total Protein: 8 g/dL (ref 6.5–8.1)

## 2021-02-05 LAB — CBC WITH DIFFERENTIAL/PLATELET
Abs Immature Granulocytes: 0.01 10*3/uL (ref 0.00–0.07)
Basophils Absolute: 0.1 10*3/uL (ref 0.0–0.1)
Basophils Relative: 1 %
Eosinophils Absolute: 0.1 10*3/uL (ref 0.0–0.5)
Eosinophils Relative: 1 %
HCT: 39.7 % (ref 36.0–46.0)
Hemoglobin: 13.5 g/dL (ref 12.0–15.0)
Immature Granulocytes: 0 %
Lymphocytes Relative: 19 %
Lymphs Abs: 1.4 10*3/uL (ref 0.7–4.0)
MCH: 32.8 pg (ref 26.0–34.0)
MCHC: 34 g/dL (ref 30.0–36.0)
MCV: 96.6 fL (ref 80.0–100.0)
Monocytes Absolute: 0.4 10*3/uL (ref 0.1–1.0)
Monocytes Relative: 6 %
Neutro Abs: 5.4 10*3/uL (ref 1.7–7.7)
Neutrophils Relative %: 73 %
Platelets: 270 10*3/uL (ref 150–400)
RBC: 4.11 MIL/uL (ref 3.87–5.11)
RDW: 11.7 % (ref 11.5–15.5)
WBC: 7.4 10*3/uL (ref 4.0–10.5)
nRBC: 0 % (ref 0.0–0.2)

## 2021-02-05 LAB — LIPASE, BLOOD: Lipase: 24 U/L (ref 11–51)

## 2021-02-05 LAB — I-STAT BETA HCG BLOOD, ED (MC, WL, AP ONLY): I-stat hCG, quantitative: <5 m[IU]/mL (ref <5)

## 2021-02-05 NOTE — ED Triage Notes (Signed)
EMS stated, for 17 days she has had vaginal bleeding and changing her tampon every hour. Having left lower quadrant pain.

## 2021-02-05 NOTE — ED Provider Triage Note (Addendum)
Emergency Medicine Provider Triage Evaluation Note  Melissa Sellers , a 41 y.o. female  was evaluated in triage.  Pt complains of vaginal bleeding x19 days.  Patient reports that she is going through 1 tampon per hour and describes excessive bleeding with blood clots.  Patient is a history of endometriosis.  Patient has not been seen by gynecologist in quite some time per her.  Patient complaining of lightheadedness, dizziness, headaches.  Patient unsure of last regular menstrual period.  Patient states the last month she bled for 15 days straight.  Patient also reports 2 days ago she had clear watery discharge from her vagina.  She believes that she ruptured an ovarian cyst.  Review of Systems  Positive: Vaginal bleeding, vaginal discharge, lightheadedness, dizziness, headache, abdominal pain Negative: Fevers, shortness of breath, nausea, vomiting  Physical Exam  LMP 01/21/2021  Gen:   Awake, no distress   Resp:  Normal effort  MSK:   Moves extremities without difficulty  Other:  LLQ abdominal pain  Medical Decision Making  Medically screening exam initiated at 12:38 PM.  Appropriate orders placed.  Melissa Sellers was informed that the remainder of the evaluation will be completed by another provider, this initial triage assessment does not replace that evaluation, and the importance of remaining in the ED until their evaluation is complete.     Melissa Decant, PA-C 02/05/21 1239    Melissa Decant, PA-C 02/05/21 1240

## 2021-02-05 NOTE — ED Notes (Signed)
Pt up to desk stating that she cannot wait to be seen

## 2021-02-05 NOTE — ED Notes (Signed)
Patient decided to leave.   

## 2021-02-20 DIAGNOSIS — N858 Other specified noninflammatory disorders of uterus: Secondary | ICD-10-CM | POA: Diagnosis not present

## 2021-04-20 ENCOUNTER — Emergency Department (HOSPITAL_BASED_OUTPATIENT_CLINIC_OR_DEPARTMENT_OTHER)
Admission: EM | Admit: 2021-04-20 | Discharge: 2021-04-20 | Disposition: A | Discharge status: Home / Self Care | Payer: Medicaid Other | Attending: Emergency Medicine | Admitting: Emergency Medicine

## 2021-04-20 ENCOUNTER — Other Ambulatory Visit: Payer: Self-pay

## 2021-04-20 DIAGNOSIS — R197 Diarrhea, unspecified: Secondary | ICD-10-CM | POA: Insufficient documentation

## 2021-04-20 DIAGNOSIS — E871 Hypo-osmolality and hyponatremia: Secondary | ICD-10-CM | POA: Insufficient documentation

## 2021-04-20 DIAGNOSIS — R1030 Lower abdominal pain, unspecified: Secondary | ICD-10-CM | POA: Diagnosis present

## 2021-04-20 DIAGNOSIS — R112 Nausea with vomiting, unspecified: Secondary | ICD-10-CM | POA: Diagnosis not present

## 2021-04-20 DIAGNOSIS — Z91040 Latex allergy status: Secondary | ICD-10-CM | POA: Diagnosis not present

## 2021-04-20 DIAGNOSIS — R109 Unspecified abdominal pain: Secondary | ICD-10-CM

## 2021-04-20 LAB — URINALYSIS, ROUTINE W REFLEX MICROSCOPIC
Bilirubin Urine: NEGATIVE
Glucose, UA: NEGATIVE mg/dL
Hgb urine dipstick: NEGATIVE
Ketones, ur: NEGATIVE mg/dL
Leukocytes,Ua: NEGATIVE
Nitrite: NEGATIVE
Protein, ur: NEGATIVE mg/dL
Specific Gravity, Urine: 1.015 (ref 1.005–1.030)
pH: 7 (ref 5.0–8.0)

## 2021-04-20 LAB — COMPREHENSIVE METABOLIC PANEL
ALT: 8 U/L (ref 0–44)
AST: 11 U/L — ABNORMAL LOW (ref 15–41)
Albumin: 4 g/dL (ref 3.5–5.0)
Alkaline Phosphatase: 33 U/L — ABNORMAL LOW (ref 38–126)
Anion gap: 8 (ref 5–15)
BUN: 7 mg/dL (ref 6–20)
CO2: 24 mmol/L (ref 22–32)
Calcium: 8.5 mg/dL — ABNORMAL LOW (ref 8.9–10.3)
Chloride: 96 mmol/L — ABNORMAL LOW (ref 98–111)
Creatinine, Ser: 0.6 mg/dL (ref 0.44–1.00)
GFR, Estimated: >60 mL/min (ref >60)
Glucose, Bld: 90 mg/dL (ref 70–99)
Potassium: 4.1 mmol/L (ref 3.5–5.1)
Sodium: 128 mmol/L — ABNORMAL LOW (ref 135–145)
Total Bilirubin: 0.3 mg/dL (ref 0.3–1.2)
Total Protein: 7.1 g/dL (ref 6.5–8.1)

## 2021-04-20 LAB — CBC
HCT: 33.1 % — ABNORMAL LOW (ref 36.0–46.0)
Hemoglobin: 11.2 g/dL — ABNORMAL LOW (ref 12.0–15.0)
MCH: 30.9 pg (ref 26.0–34.0)
MCHC: 33.8 g/dL (ref 30.0–36.0)
MCV: 91.4 fL (ref 80.0–100.0)
Platelets: 206 10*3/uL (ref 150–400)
RBC: 3.62 MIL/uL — ABNORMAL LOW (ref 3.87–5.11)
RDW: 12.1 % (ref 11.5–15.5)
WBC: 7 10*3/uL (ref 4.0–10.5)
nRBC: 0 % (ref 0.0–0.2)

## 2021-04-20 LAB — PREGNANCY, URINE: Preg Test, Ur: NEGATIVE

## 2021-04-20 LAB — LIPASE, BLOOD: Lipase: <10 U/L — ABNORMAL LOW (ref 11–51)

## 2021-04-20 MED ORDER — LACTATED RINGERS IV SOLN: INTRAVENOUS | Status: DC

## 2021-04-20 MED ORDER — LACTATED RINGERS IV BOLUS
1000.0000 mL | Freq: Once | INTRAVENOUS | Status: AC
  Administered 2021-04-20: 1000 mL via INTRAVENOUS

## 2021-04-20 MED ORDER — KETOROLAC TROMETHAMINE 30 MG/ML IJ SOLN
15.0000 mg | Freq: Once | INTRAMUSCULAR | Status: AC
  Administered 2021-04-20: 15 mg via INTRAVENOUS
  Filled 2021-04-20: qty 1

## 2021-04-20 MED ORDER — METOCLOPRAMIDE HCL 5 MG/ML IJ SOLN
5.0000 mg | Freq: Once | INTRAMUSCULAR | Status: AC
Start: 2021-04-20 — End: 2021-04-20
  Administered 2021-04-20: 5 mg via INTRAVENOUS
  Filled 2021-04-20: qty 2

## 2021-04-20 MED ORDER — MORPHINE SULFATE (PF) 4 MG/ML IV SOLN
6.0000 mg | Freq: Once | INTRAVENOUS | Status: AC
  Administered 2021-04-20: 6 mg via INTRAVENOUS
  Filled 2021-04-20: qty 2

## 2021-04-20 MED ORDER — TRAMADOL HCL 50 MG PO TABS: 50.0000 mg | ORAL_TABLET | Freq: Four times a day (QID) | ORAL | 0 refills | Status: DC | PRN

## 2021-04-20 NOTE — ED Triage Notes (Signed)
Pt arrived via [facility] from home. Pt arrived caox4, pt visibly tearful and upset. Per EMS, pt c/o back pain and lower abdominal pain which is chronic d/t hx endometriosis and interstitial cystitis. Pt takes Vicodin and xanax at home but took her last Vicodin this morning and supplemented with her xanax taking approx 25 xanax tablets in the past 4 days. Pt requested transport to ED d/t lack of pain control at home. Pt has also had N/V/D for the past 3 days which she took her prescription zofran for today.  ?

## 2021-04-20 NOTE — ED Provider Notes (Signed)
?[place] EMERGENCY DEPT ?Provider Note ? ? ?CSN: [id number] ?Arrival date & time: 04/20/21  1855 ? ?  ? ?History ? ?Chief Complaint  ?Patient presents with  ? Back Pain  ? ? ?Kaylissa Conley is a 41 y.o. female. ? ?41 year old female who has history of interstitial cystitis along with endometriosis presents with worsening chronic lower abdominal discomfort.  She had nausea vomiting diarrhea.  Denies any fever or chills.  Has taken Zofran with limited relief.  Has been trying to self medicate at home with Vicodin and Xanax with limited relief.  Has had increased stress recently due to impending eviction from her home as well as her daughter going to live with her father.  Denies any new vaginal bleeding.  Presents via EMS ? ? ?  ? ?Home Medications ?Prior to Admission medications   ?Medication Sig Start Date End Date Taking? Authorizing Provider  ?ALPRAZolam (XANAX) 0.5 MG tablet Take 0.5 mg by mouth 2 (two) times daily as needed for anxiety.  05/29/19   [provider]  ?citalopram (CELEXA) 20 MG tablet Take 20 mg by mouth at bedtime.  06/18/19   [provider]  ?HYDROcodone-acetaminophen (NORCO) 10-325 MG tablet Take 1 tablet by mouth every 6 (six) hours as needed. 06/28/19   Drenda Freeze, MD  ?metroNIDAZOLE (METROGEL VAGINAL) 0.75 % vaginal gel Place 1 Applicatorful vaginally 2 (two) times daily. 06/19/19   Delia Heady, PA-C  ?   ? ?Allergies    ?Amitriptyline, Ciprofloxacin, Codeine, Diflucan [fluconazole], Doxycycline, Penicillins, Shellfish allergy, Sulfa antibiotics, Tramadol, Flagyl [metronidazole hcl], and Latex   ? ?Review of Systems   ?Review of Systems  ?All other systems reviewed and are negative. ? ?Physical Exam ?Updated Vital Signs ?BP 118/78 (BP Location: Right Arm)   Pulse 72   Temp 98.2 ?F (36.8 ?C)   Resp 18   Ht 1.524 m (5')   Wt 62.1 kg   LMP 03/24/2021 (Approximate)   SpO2 100%   BMI 26.76 kg/m?  ?Physical Exam ?Vitals and nursing note reviewed.   ?Constitutional:   ?   General: She is not in acute distress. ?   Appearance: Normal appearance. She is well-developed. She is not toxic-appearing.  ?HENT:  ?   Head: Normocephalic and atraumatic.  ?Eyes:  ?   General: Lids are normal.  ?   Conjunctiva/sclera: Conjunctivae normal.  ?   Pupils: Pupils are equal, round, and reactive to light.  ?Neck:  ?   Thyroid: No thyroid mass.  ?   Trachea: No tracheal deviation.  ?Cardiovascular:  ?   Rate and Rhythm: Normal rate and regular rhythm.  ?   Heart sounds: Normal heart sounds. No murmur heard. ?  No gallop.  ?Pulmonary:  ?   Effort: Pulmonary effort is normal. No respiratory distress.  ?   Breath sounds: Normal breath sounds. No stridor. No decreased breath sounds, wheezing, rhonchi or rales.  ?Abdominal:  ?   General: There is no distension.  ?   Palpations: Abdomen is soft.  ?   Tenderness: There is no abdominal tenderness. There is no rebound.  ?Musculoskeletal:     ?   General: No tenderness. Normal range of motion.  ?   Cervical back: Normal range of motion and neck supple.  ?Skin: ?   General: Skin is warm and dry.  ?   Findings: No abrasion or rash.  ?Neurological:  ?   Mental Status: She is alert and oriented to person, place, and time. Mental  status is at baseline.  ?   GCS: GCS eye subscore is 4. GCS verbal subscore is 5. GCS motor subscore is 6.  ?   Cranial Nerves: No cranial nerve deficit.  ?   Sensory: No sensory deficit.  ?   Motor: Motor function is intact.  ?Psychiatric:     ?   Attention and Perception: Attention normal.     ?   Speech: Speech normal.     ?   Behavior: Behavior normal.  ? ?ED Results / Procedures / Treatments   ?Labs ?(all labs ordered are listed, but only abnormal results are displayed) ?Labs Reviewed  ?LIPASE, BLOOD - Abnormal; Notable for the following components:  ?    Result Value  ? Lipase <10 (*)   ? All other components within normal limits  ?COMPREHENSIVE METABOLIC PANEL - Abnormal; Notable for the following components:  ?  Sodium 128 (*)   ? Chloride 96 (*)   ? Calcium 8.5 (*)   ? AST 11 (*)   ? Alkaline Phosphatase 33 (*)   ? All other components within normal limits  ?CBC - Abnormal; Notable for the following components:  ? RBC 3.62 (*)   ? Hemoglobin 11.2 (*)   ? HCT 33.1 (*)   ? All other components within normal limits  ?URINALYSIS, ROUTINE W REFLEX MICROSCOPIC - Abnormal; Notable for the following components:  ? Color, Urine COLORLESS (*)   ? All other components within normal limits  ?PREGNANCY, URINE  ? ? ?EKG ?None ? ?Radiology ?No results found. ? ?Procedures ?Procedures  ? ? ?Medications Ordered in ED ?Medications  ?ketorolac (TORADOL) 30 MG/ML injection 15 mg (has no administration in time range)  ?morphine (PF) 4 MG/ML injection 6 mg (has no administration in time range)  ?metoCLOPramide (REGLAN) injection 5 mg (has no administration in time range)  ? ? ?ED Course/ Medical Decision Making/ A&P ?  ?                        ?Medical Decision Making ?Amount and/or Complexity of Data Reviewed ?Labs: ordered. ? ?Risk ?Prescription drug management. ? ? ?Patient medicated for pain here.  Urinalysis is negative.  She has mild hyponatremia.  Renal function is normal.  Suspect her pain is chronic in nature.  No leukocytosis on CBC.  Do not feel that she needs to have repeat abdominal imaging.  Patient structured to follow-up with her urologist ? ? ? ? ? ? ? ?Final Clinical Impression(s) / ED Diagnoses ?Final diagnoses:  ?None  ? ? ?Rx / DC Orders ?ED Discharge Orders   ? ? None  ? ?  ? ? ?  ?Lacretia Leigh, MD ?04/20/21 2207 ? ?

## 2021-04-23 ENCOUNTER — Emergency Department (HOSPITAL_COMMUNITY)
Admission: EM | Admit: 2021-04-23 | Discharge: 2021-04-23 | Discharge status: Against Medical Advice | Payer: Medicaid Other | Attending: Emergency Medicine | Admitting: Emergency Medicine

## 2021-04-23 ENCOUNTER — Encounter (HOSPITAL_COMMUNITY): Payer: Self-pay

## 2021-04-23 DIAGNOSIS — R630 Anorexia: Secondary | ICD-10-CM | POA: Insufficient documentation

## 2021-04-23 DIAGNOSIS — Z5321 Procedure and treatment not carried out due to patient leaving prior to being seen by health care provider: Secondary | ICD-10-CM | POA: Insufficient documentation

## 2021-04-23 NOTE — ED Triage Notes (Signed)
Pt arrived via [facility] from home.  ? ?Pt c/o Nausea, dizziness, anxiety, insomnia x3 days, poor appetitive.  ? ? ?Pt thinks she may be having a possible reaction with tramadol. Pt has various scenarios about what's going.  ? ? ?A/Ox4 ? ?

## 2021-04-23 NOTE — ED Notes (Signed)
Pt states that if she is going to die then she is going to die at home. Pt does not want to wait to be seen. Pt is leaving the facility.  ?

## 2021-04-24 ENCOUNTER — Emergency Department (HOSPITAL_BASED_OUTPATIENT_CLINIC_OR_DEPARTMENT_OTHER)
Admission: EM | Admit: 2021-04-24 | Discharge: 2021-04-24 | Disposition: A | Discharge status: Home / Self Care | Payer: Medicaid Other | Attending: Emergency Medicine | Admitting: Emergency Medicine

## 2021-04-24 ENCOUNTER — Encounter (HOSPITAL_BASED_OUTPATIENT_CLINIC_OR_DEPARTMENT_OTHER): Payer: Self-pay

## 2021-04-24 ENCOUNTER — Other Ambulatory Visit: Payer: Self-pay

## 2021-04-24 DIAGNOSIS — Z91040 Latex allergy status: Secondary | ICD-10-CM | POA: Insufficient documentation

## 2021-04-24 DIAGNOSIS — T887XXA Unspecified adverse effect of drug or medicament, initial encounter: Secondary | ICD-10-CM

## 2021-04-24 DIAGNOSIS — T424X5A Adverse effect of benzodiazepines, initial encounter: Secondary | ICD-10-CM | POA: Insufficient documentation

## 2021-04-24 DIAGNOSIS — R030 Elevated blood-pressure reading, without diagnosis of hypertension: Secondary | ICD-10-CM | POA: Insufficient documentation

## 2021-04-24 LAB — CBC
HCT: 39 % (ref 36.0–46.0)
Hemoglobin: 13.2 g/dL (ref 12.0–15.0)
MCH: 31.2 pg (ref 26.0–34.0)
MCHC: 33.8 g/dL (ref 30.0–36.0)
MCV: 92.2 fL (ref 80.0–100.0)
Platelets: 302 10*3/uL (ref 150–400)
RBC: 4.23 MIL/uL (ref 3.87–5.11)
RDW: 13 % (ref 11.5–15.5)
WBC: 8 10*3/uL (ref 4.0–10.5)
nRBC: 0 % (ref 0.0–0.2)

## 2021-04-24 LAB — COMPREHENSIVE METABOLIC PANEL
ALT: 13 U/L (ref 0–44)
AST: 19 U/L (ref 15–41)
Albumin: 4.7 g/dL (ref 3.5–5.0)
Alkaline Phosphatase: 40 U/L (ref 38–126)
Anion gap: 12 (ref 5–15)
BUN: 6 mg/dL (ref 6–20)
CO2: 25 mmol/L (ref 22–32)
Calcium: 9.3 mg/dL (ref 8.9–10.3)
Chloride: 101 mmol/L (ref 98–111)
Creatinine, Ser: 0.61 mg/dL (ref 0.44–1.00)
GFR, Estimated: >60 mL/min (ref >60)
Glucose, Bld: 86 mg/dL (ref 70–99)
Potassium: 3.6 mmol/L (ref 3.5–5.1)
Sodium: 138 mmol/L (ref 135–145)
Total Bilirubin: 0.3 mg/dL (ref 0.3–1.2)
Total Protein: 8.2 g/dL — ABNORMAL HIGH (ref 6.5–8.1)

## 2021-04-24 MED ORDER — DIAZEPAM 2 MG PO TABS: 2.0000 mg | ORAL_TABLET | Freq: Every evening | ORAL | 0 refills | Status: DC

## 2021-04-24 MED ORDER — SODIUM CHLORIDE 0.9 % IV BOLUS
1000.0000 mL | Freq: Once | INTRAVENOUS | Status: AC
  Administered 2021-04-24: 1000 mL via INTRAVENOUS

## 2021-04-24 MED ORDER — DIAZEPAM 2 MG PO TABS
2.0000 mg | ORAL_TABLET | Freq: Once | ORAL | Status: AC
  Administered 2021-04-24: 2 mg via ORAL
  Filled 2021-04-24: qty 1

## 2021-04-24 NOTE — ED Provider Notes (Signed)
?[facility] EMERGENCY DEPT ?Provider Note ? ? ?CSN: [id number] ?Arrival date & time: 04/24/21  1545 ? ?  ? ?History ? ?No chief complaint on file. ? ? ?Melissa Sellers is a 41 y.o. female who presents the emergency department with concern for reaction from medication.  Patient states that she was given tramadol when she was seen in the ER on 4/9 for abdominal pain.  When she went home she continued to take her prescribed Celexa.  She then began having multiple symptoms including heightened sensory sensations, feeling "out of her mind", and feeling like she was "burning from the inside".  She also felt as though her heart was racing and her blood pressure was high.  She had done some research and was concerned that she was experiencing symptoms of serotonin syndrome.  She contacted poison control who describes she would need a 48 to 72-hour period of observation.  She had also seen in her research that benzodiazepines can be used for serotonin syndrome, so she was trying to take her prescribed Xanax to relieve the symptoms.  When she contacted her primary care doctor, they explained to her that her Xanax may be making her symptoms worse, and I advised that she come to the emergency department immediately. ? ?HPI ? ?  ? ?Home Medications ?Prior to Admission medications   ?Medication Sig Start Date End Date Taking? Authorizing Provider  ?diazepam (VALIUM) 2 MG tablet Take 1 tablet (2 mg total) by mouth at bedtime. 04/24/21  Yes Tiffay Pinette T, PA-C  ?citalopram (CELEXA) 20 MG tablet Take 20 mg by mouth at bedtime.  06/18/19   [provider]  ?HYDROcodone-acetaminophen (NORCO) 10-325 MG tablet Take 1 tablet by mouth every 6 (six) hours as needed. 06/28/19   Charlynne PanderYao, David Hsienta, MD  ?metroNIDAZOLE (METROGEL VAGINAL) 0.75 % vaginal gel Place 1 Applicatorful vaginally 2 (two) times daily. 06/19/19   Khatri, Hina, PA-C  ?traMADol (ULTRAM) 50 MG tablet Take 1 tablet (50 mg total) by mouth every 6 (six)  hours as needed. 04/20/21   Lorre NickAllen, Anthony, MD  ?   ? ?Allergies    ?Amitriptyline, Ciprofloxacin, Codeine, Diflucan [fluconazole], Doxycycline, Penicillins, Shellfish allergy, Sulfa antibiotics, Tramadol, Flagyl [metronidazole hcl], and Latex   ? ?Review of Systems   ?Review of Systems  ?Constitutional:  Positive for appetite change.  ?Eyes:  Positive for visual disturbance.  ?Cardiovascular:  Positive for palpitations.  ?Neurological:  Positive for dizziness and numbness.  ?Psychiatric/Behavioral:  Positive for agitation. The patient is nervous/anxious.   ?All other systems reviewed and are negative. ? ?Physical Exam ?Updated Vital Signs ?BP 134/82 (BP Location: Right Arm)   Pulse 72   Temp 98.1 ?F (36.7 ?C) (Oral)   Resp 20   SpO2 100%  ?Physical Exam ?Vitals and nursing note reviewed.  ?Constitutional:   ?   Appearance: Normal appearance.  ?HENT:  ?   Head: Normocephalic and atraumatic.  ?Eyes:  ?   Conjunctiva/sclera: Conjunctivae normal.  ?Cardiovascular:  ?   Rate and Rhythm: Normal rate and regular rhythm.  ?Pulmonary:  ?   Effort: Pulmonary effort is normal. No respiratory distress.  ?   Breath sounds: Normal breath sounds.  ?Abdominal:  ?   General: There is no distension.  ?   Palpations: Abdomen is soft.  ?   Tenderness: There is no abdominal tenderness.  ?Skin: ?   General: Skin is warm and dry.  ?Neurological:  ?   General: No focal deficit present.  ?  Mental Status: She is alert.  ?   Comments: Neuro: Speech is clear, able to follow commands. CN III-XII intact grossly intact. PERRLA. EOMI. Sensation intact throughout.  ? ? ?ED Results / Procedures / Treatments   ?Labs ?(all labs ordered are listed, but only abnormal results are displayed) ?Labs Reviewed  ?COMPREHENSIVE METABOLIC PANEL - Abnormal; Notable for the following components:  ?    Result Value  ? Total Protein 8.2 (*)   ? All other components within normal limits  ?CBC  ? ? ?EKG ?None ? ?Radiology ?No results  found. ? ?Procedures ?Procedures  ? ? ?Medications Ordered in ED ?Medications  ?sodium chloride 0.9 % bolus 1,000 mL (0 mLs Intravenous Stopped 04/24/21 1958)  ?diazepam (VALIUM) tablet 2 mg (2 mg Oral Given 04/24/21 1957)  ? ? ?ED Course/ Medical Decision Making/ A&P ?  ?                        ?Medical Decision Making ?Amount and/or Complexity of Data Reviewed ?Labs: ordered. ? ?Risk ?Prescription drug management. ? ? ?This patient is a 41 year old female who presents to the ED for concern of medication adverse reaction.  ? ?Past Medical History / Co-morbidities: ?Migraines, interstitial cystitis, endometriosis, anxiety ? ?Additional history: ?Personally viewed patient's ER visit note from 4/9.  I do not see she was given tramadol.  Says she had Toradol, morphine, and Reglan.  She was discharged in stable condition with unspecific abdominal pain and instructed to follow-up with her urologist. ? ?Physical Exam: ?Physical exam performed. The pertinent findings include: Patient is afebrile, not tachycardic, not hypoxic, and in no acute distress.  Neurologic exam grossly normal as above. ? ?Lab Tests/Imaging studies: ?I Ordered, and personally interpreted labs/imaging including CBC, CMP.  The pertinent results include: No leukocytosis, normal hemoglobin.  Electrolytes within normal limits. ? ?Medications: ?I ordered medication including IV fluids and Valium. I have reviewed the patients home medicines and have made adjustments as needed. ?  ?Disposition: ?After consideration of the diagnostic results and the patients response to treatment, I feel that patient is not requiring admission or inpatient treatment for her symptoms.  Based on Hunter toxicity criteria, although patient was on a serotonergic agent, she does not exhibit the physical neurologic symptoms consistent with serotonin toxicity (clonus, hyperthermia, hypertonia, etc.). Anticipate she likely had a side effect from one of the medications, compounded with  feelings of anxiety about the possibility of serotonin syndrome.  I feel very reassured that patient feels slightly improved after some IV fluids and benzodiazepines.  As patient had worries about the Xanax making her symptoms worse, will change her at home prescription from Xanax to Valium.  I also encouraged her to follow-up with her primary care doctor, to discuss long-term medication management. Discussed reasons to return to the emergency department, and the patient is agreeable to the plan.  ? ?I discussed this case with my attending physician Dr. Silverio Lay who cosigned this note including patient's presenting symptoms, physical exam, and planned diagnostics and interventions. Attending physician stated agreement with plan or made changes to plan which were implemented.  ? ?Final Clinical Impression(s) / ED Diagnoses ?Final diagnoses:  ?Medication side effect  ? ? ?Rx / DC Orders ?ED Discharge Orders   ? ?      Ordered  ?  diazepam (VALIUM) 2 MG tablet  Nightly       ? 04/24/21 2034  ? ?  ?  ? ?  ? ?  Portions of this report may have been transcribed using voice recognition software. Every effort was made to ensure accuracy; however, inadvertent computerized transcription errors may be present. ? ?  ?Su Monks, PA-C ?04/25/21 1147 ? ?  ?Charlynne Pander, MD ?04/26/21 1453 ? ?

## 2021-04-24 NOTE — ED Triage Notes (Signed)
Pt states she has been taking celexa and tramadol. Pt states she is have "a reaction." Pt states she has been taking Xanax to help relieve the symptoms. 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 States he PCP told her to come to the hospital.  ?

## 2021-04-24 NOTE — Discharge Instructions (Addendum)
You were seen in the emergency department for side effects due to medication. ? ?Your work-up is looked reassuring including your vital signs and your blood work.  We have given you some fluids and a dose of Valium.  I am prescribing you some Valium that you can take in place of your Xanax. I recommend following up with your primary doctor for long term medication management. ? ?Continue to monitor how you're doing and return to the ER for new or worsening symptoms.  ?

## 2021-04-25 ENCOUNTER — Encounter (HOSPITAL_BASED_OUTPATIENT_CLINIC_OR_DEPARTMENT_OTHER): Payer: Self-pay | Admitting: Emergency Medicine

## 2021-04-25 ENCOUNTER — Emergency Department (HOSPITAL_BASED_OUTPATIENT_CLINIC_OR_DEPARTMENT_OTHER)
Admission: EM | Admit: 2021-04-25 | Discharge: 2021-04-25 | Disposition: A | Discharge status: Home / Self Care | Payer: Medicaid Other | Attending: Emergency Medicine | Admitting: Emergency Medicine

## 2021-04-25 DIAGNOSIS — R531 Weakness: Secondary | ICD-10-CM

## 2021-04-25 DIAGNOSIS — M7918 Myalgia, other site: Secondary | ICD-10-CM | POA: Insufficient documentation

## 2021-04-25 DIAGNOSIS — F419 Anxiety disorder, unspecified: Secondary | ICD-10-CM | POA: Insufficient documentation

## 2021-04-25 DIAGNOSIS — R4789 Other speech disturbances: Secondary | ICD-10-CM | POA: Insufficient documentation

## 2021-04-25 DIAGNOSIS — Z91040 Latex allergy status: Secondary | ICD-10-CM | POA: Insufficient documentation

## 2021-04-25 LAB — CBC WITH DIFFERENTIAL/PLATELET
Abs Immature Granulocytes: 0.02 10*3/uL (ref 0.00–0.07)
Basophils Absolute: 0 10*3/uL (ref 0.0–0.1)
Basophils Relative: 0 %
Eosinophils Absolute: 0 10*3/uL (ref 0.0–0.5)
Eosinophils Relative: 0 %
HCT: 36.7 % (ref 36.0–46.0)
Hemoglobin: 12.3 g/dL (ref 12.0–15.0)
Immature Granulocytes: 0 %
Lymphocytes Relative: 16 %
Lymphs Abs: 1.2 10*3/uL (ref 0.7–4.0)
MCH: 31.1 pg (ref 26.0–34.0)
MCHC: 33.5 g/dL (ref 30.0–36.0)
MCV: 92.9 fL (ref 80.0–100.0)
Monocytes Absolute: 0.4 10*3/uL (ref 0.1–1.0)
Monocytes Relative: 6 %
Neutro Abs: 5.9 10*3/uL (ref 1.7–7.7)
Neutrophils Relative %: 78 %
Platelets: 288 10*3/uL (ref 150–400)
RBC: 3.95 MIL/uL (ref 3.87–5.11)
RDW: 13.1 % (ref 11.5–15.5)
WBC: 7.6 10*3/uL (ref 4.0–10.5)
nRBC: 0 % (ref 0.0–0.2)

## 2021-04-25 LAB — CK: Total CK: 160 U/L (ref 38–234)

## 2021-04-25 LAB — BASIC METABOLIC PANEL
Anion gap: 10 (ref 5–15)
BUN: 6 mg/dL (ref 6–20)
CO2: 25 mmol/L (ref 22–32)
Calcium: 9.1 mg/dL (ref 8.9–10.3)
Chloride: 104 mmol/L (ref 98–111)
Creatinine, Ser: 0.59 mg/dL (ref 0.44–1.00)
GFR, Estimated: >60 mL/min (ref >60)
Glucose, Bld: 90 mg/dL (ref 70–99)
Potassium: 3.7 mmol/L (ref 3.5–5.1)
Sodium: 139 mmol/L (ref 135–145)

## 2021-04-25 MED ORDER — LORAZEPAM 2 MG/ML IJ SOLN
1.0000 mg | Freq: Once | INTRAMUSCULAR | Status: AC
  Administered 2021-04-25: 1 mg via INTRAVENOUS
  Filled 2021-04-25: qty 1

## 2021-04-25 MED ORDER — LACTATED RINGERS IV BOLUS
1000.0000 mL | Freq: Once | INTRAVENOUS | Status: AC
  Administered 2021-04-25: 1000 mL via INTRAVENOUS

## 2021-04-25 MED ORDER — HALOPERIDOL LACTATE 5 MG/ML IJ SOLN
0.5000 mg | Freq: Once | INTRAMUSCULAR | Status: DC
  Filled 2021-04-25: qty 1

## 2021-04-25 MED ORDER — LACTATED RINGERS IV SOLN: INTRAVENOUS | Status: DC
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
## 2021-04-25 NOTE — ED Provider Notes (Signed)
?[facility] EMERGENCY DEPT ?Provider Note ? ? ?CSN: [id number] ?Arrival date & time: 04/25/21  1407 ? ?  ? ?History ? ?No chief complaint on file. ? ? ?[address] is a 41 y.o. female. ? ?41 year old female presents with concern for possible serotonin syndrome.  Patient was prescribed tramadol several days ago and was concerned about interaction with her Celexa.  She stopped her tramadol several days ago.  Since that time she states she had a temperature at home of 99.6.  She has been using Xanax as well as hydrocodone which does make her symptoms somewhat better.  She does have a history of interstitial cystitis.  Denies any fever above 101 degrees.  Slight muscle aches noted.  Patient states that she has had trouble breathing several times throughout the evening.  Denies any SI or HI.  No hallucinations ? ? ?  ? ?Home Medications ?Prior to Admission medications   ?Medication Sig Start Date End Date Taking? Authorizing Provider  ?citalopram (CELEXA) 20 MG tablet Take 20 mg by mouth at bedtime.  06/18/19   [provider]  ?diazepam (VALIUM) 2 MG tablet Take 1 tablet (2 mg total) by mouth at bedtime. 04/24/21   Roemhildt, Lorin T, PA-C  ?HYDROcodone-acetaminophen (NORCO) 10-325 MG tablet Take 1 tablet by mouth every 6 (six) hours as needed. 06/28/19   Drenda Freeze, MD  ?metroNIDAZOLE (METROGEL VAGINAL) 0.75 % vaginal gel Place 1 Applicatorful vaginally 2 (two) times daily. 06/19/19   Khatri, Hina, PA-C  ?traMADol (ULTRAM) 50 MG tablet Take 1 tablet (50 mg total) by mouth every 6 (six) hours as needed. 04/20/21   Lacretia Leigh, MD  ?   ? ?Allergies    ?Amitriptyline, Ciprofloxacin, Codeine, Diflucan [fluconazole], Doxycycline, Penicillins, Shellfish allergy, Sulfa antibiotics, Tramadol, Flagyl [metronidazole hcl], and Latex   ? ?Review of Systems   ?Review of Systems  ?All other systems reviewed and are negative. ? ?Physical Exam ?Updated Vital Signs ?BP 138/83 (BP Location: Left Arm)    Pulse 71   Temp 98.7 ?F (37.1 ?C)   Resp 14   Ht 1.524 m (5')   Wt 59 kg   SpO2 100%   BMI 25.40 kg/m?  ?Physical Exam ?Vitals and nursing note reviewed.  ?Constitutional:   ?   General: She is not in acute distress. ?   Appearance: Normal appearance. She is well-developed. She is not toxic-appearing.  ?HENT:  ?   Head: Normocephalic and atraumatic.  ?Eyes:  ?   General: Lids are normal.  ?   Conjunctiva/sclera: Conjunctivae normal.  ?   Pupils: Pupils are equal, round, and reactive to light.  ?Neck:  ?   Thyroid: No thyroid mass.  ?   Trachea: No tracheal deviation.  ?Cardiovascular:  ?   Rate and Rhythm: Normal rate and regular rhythm.  ?   Heart sounds: Normal heart sounds. No murmur heard. ?  No gallop.  ?Pulmonary:  ?   Effort: Pulmonary effort is normal. No respiratory distress.  ?   Breath sounds: Normal breath sounds. No stridor. No decreased breath sounds, wheezing, rhonchi or rales.  ?Abdominal:  ?   General: There is no distension.  ?   Palpations: Abdomen is soft.  ?   Tenderness: There is no abdominal tenderness. There is no rebound.  ?Musculoskeletal:     ?   General: No tenderness. Normal range of motion.  ?   Cervical back: Normal range of motion and neck supple.  ?Skin: ?   General:  Skin is warm and dry.  ?   Findings: No abrasion or rash.  ?Neurological:  ?   Mental Status: She is alert and oriented to person, place, and time. Mental status is at baseline.  ?   GCS: GCS eye subscore is 4. GCS verbal subscore is 5. GCS motor subscore is 6.  ?   Cranial Nerves: No cranial nerve deficit.  ?   Sensory: No sensory deficit.  ?   Motor: Motor function is intact.  ?Psychiatric:     ?   Attention and Perception: Attention normal.     ?   Mood and Affect: Mood is anxious.     ?   Speech: Speech is rapid and pressured.     ?   Behavior: Behavior normal.  ? ? ?ED Results / Procedures / Treatments   ?Labs ?(all labs ordered are listed, but only abnormal results are displayed) ?Labs Reviewed  ?CBC WITH  DIFFERENTIAL/PLATELET  ?BASIC METABOLIC PANEL  ?CK  ? ? ?EKG ?None ? ?Radiology ?No results found. ? ?Procedures ?Procedures  ? ? ?Medications Ordered in ED ?Medications  ?lactated ringers bolus 1,000 mL (has no administration in time range)  ?lactated ringers infusion (has no administration in time range)  ?LORazepam (ATIVAN) injection 1 mg (has no administration in time range)  ?haloperidol lactate (HALDOL) injection 0.5 mg (has no administration in time range)  ? ? ?ED Course/ Medical Decision Making/ A&P ?  ?                        ?Medical Decision Making ?Amount and/or Complexity of Data Reviewed ?Labs: ordered. ? ?Risk ?Prescription drug management. ? ? ?Patient presented with increased anxiety and concern for possible serotonin syndrome.  Patient has no signs of this.  I did repeat blood work here which showed a normal CBC normal BMP and normal CK.  Patient given IV fluids along with Haldol and Ativan and feels much better.  Will discharge home at this time.  Patient comfortable with this ? ? ? ? ? ? ?Final Clinical Impression(s) / ED Diagnoses ?Final diagnoses:  ?None  ? ? ?Rx / DC Orders ?ED Discharge Orders   ? ? None  ? ?  ? ? ?  ?Lacretia Leigh, MD ?04/25/21 1819 ? ?

## 2021-04-25 NOTE — ED Triage Notes (Signed)
Pt states she has been here recently for serotonin toxicity, seen here last night and given IV fluids. Endorses fever x4 hours (99.6), tremors and that she stopped breathing 3 times over night where it "shocked her awake." Pt tearful and anxious.  ?

## 2021-07-02 IMAGING — US US PELVIS COMPLETE
1 series · 13 of 25 positions shown · non-contrast
Comparison: 08/12/2013

CLINICAL DATA: Pelvic pain for a couple days, history of
endometriosis, prior LEFT ovarian excision, RIGHT salpingectomy,
Caesarean section; LMP 05/19/2019

EXAM:
TRANSABDOMINAL AND TRANSVAGINAL ULTRASOUND OF PELVIS
DOPPLER ULTRASOUND OF OVARIES
TECHNIQUE: Both transabdominal and transvaginal ultrasound examinations of the
pelvis were performed. Transabdominal technique was performed for
global imaging of the pelvis including uterus, ovaries, adnexal
regions, and pelvic cul-de-sac.
It was necessary to proceed with endovaginal exam following the
transabdominal exam to visualize the endometrium and RIGHT ovary.
Color and duplex Doppler ultrasound was utilized to evaluate blood
flow to the ovaries.

[Series 1: us pelvis complete · 13 of 102 slices shown]
[im 1/102]
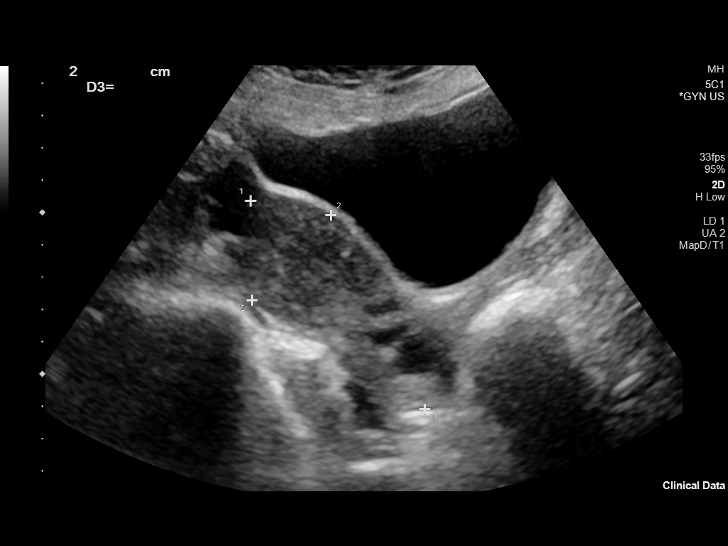
[im 9/102]
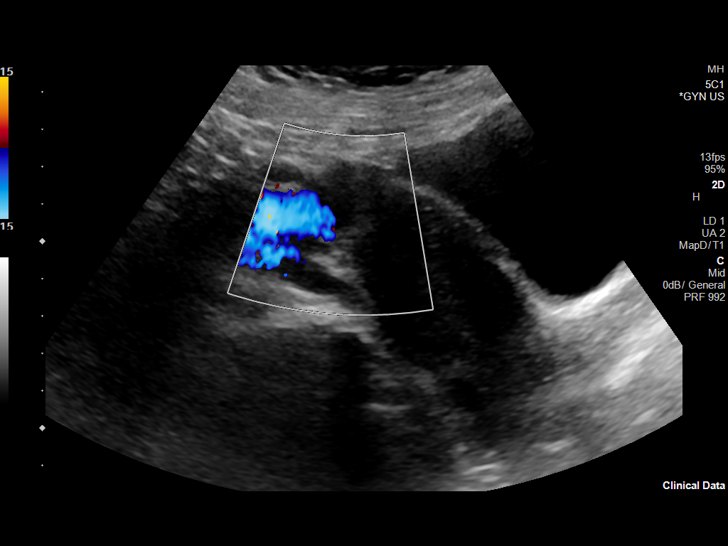
[im 17/102]
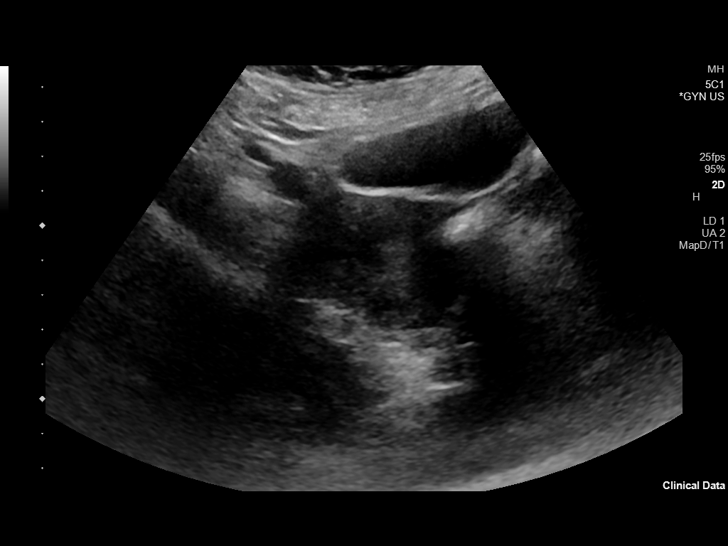
[im 26/102]
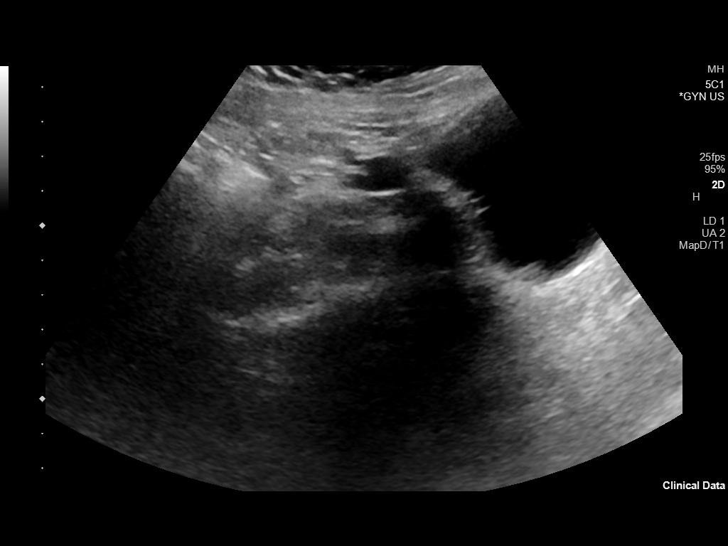
[im 34/102]
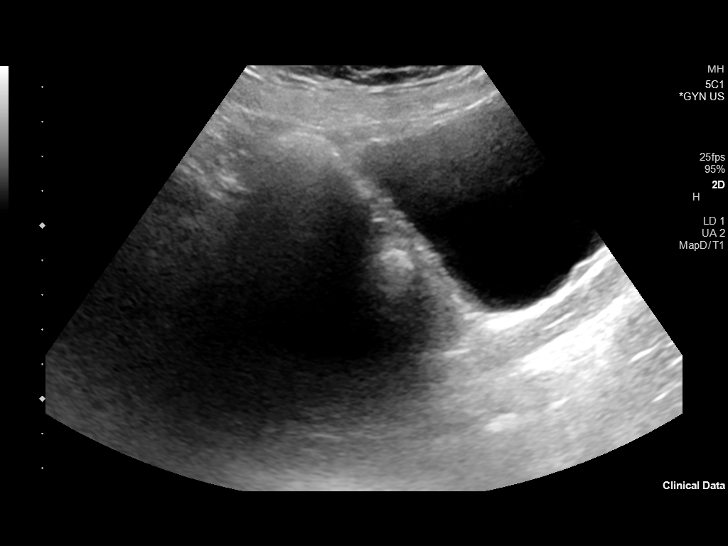
[im 43/102]
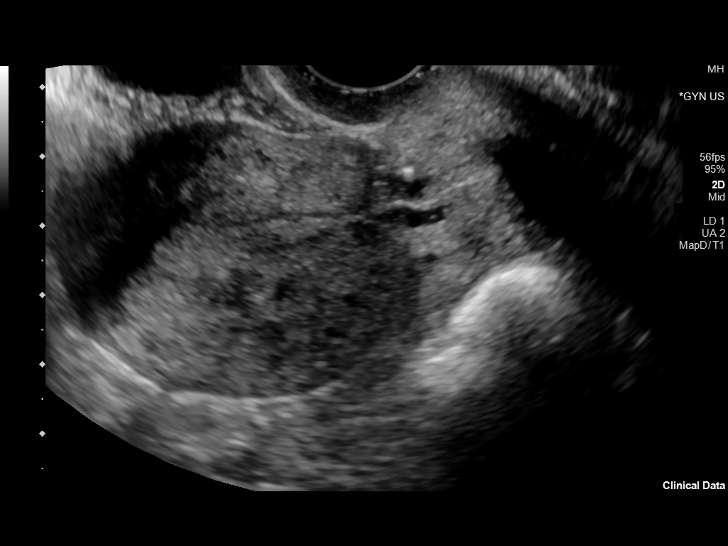
[im 51/102]
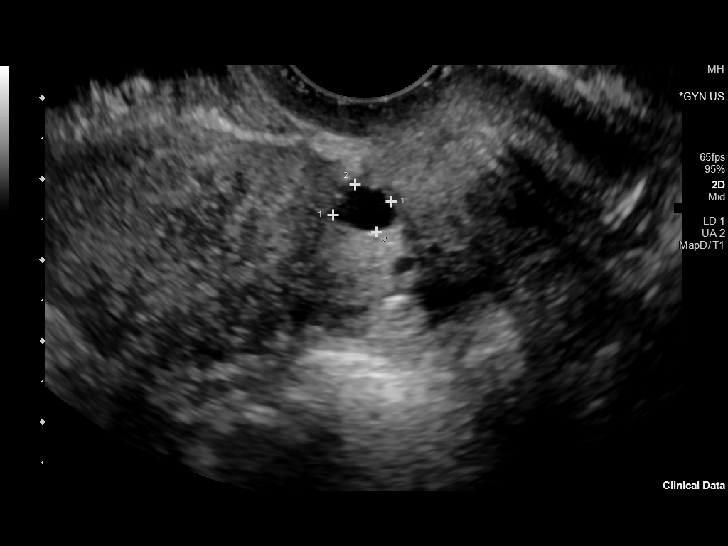
[im 59/102]
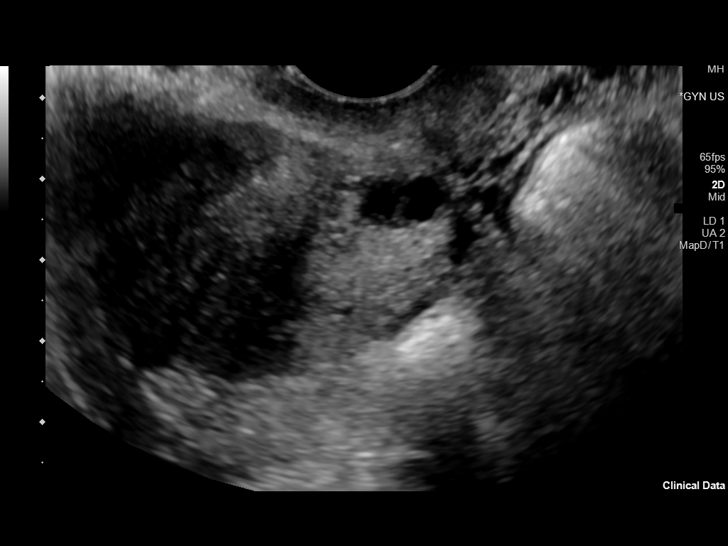
[im 68/102]
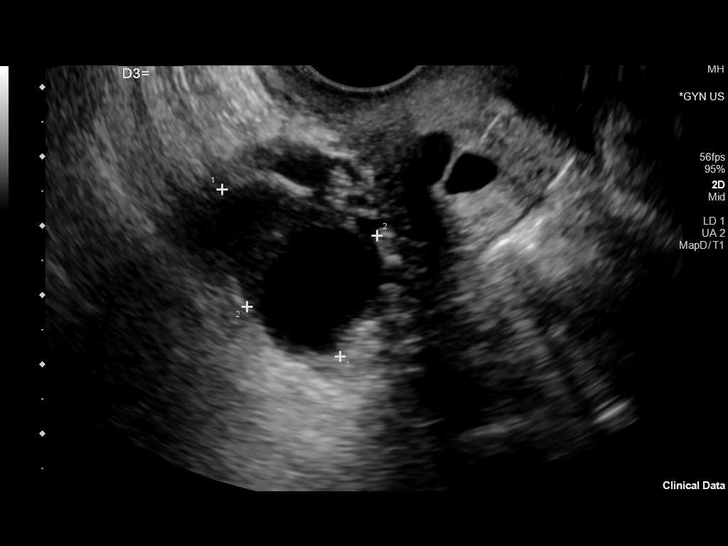
[im 76/102]
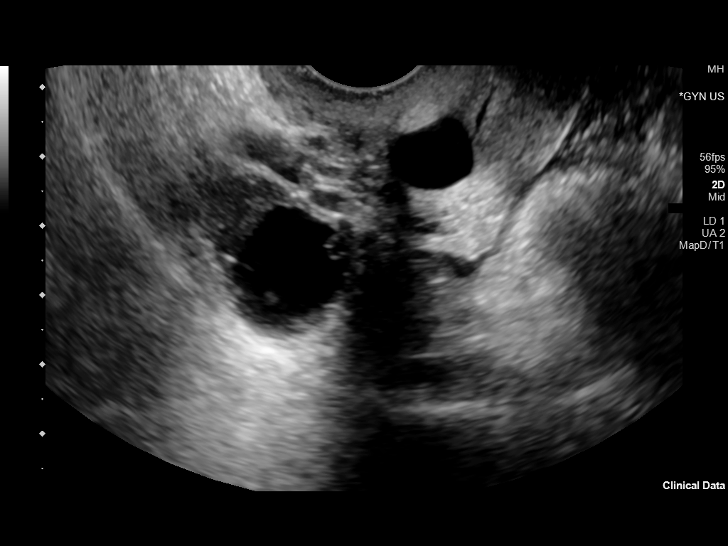
[im 85/102]
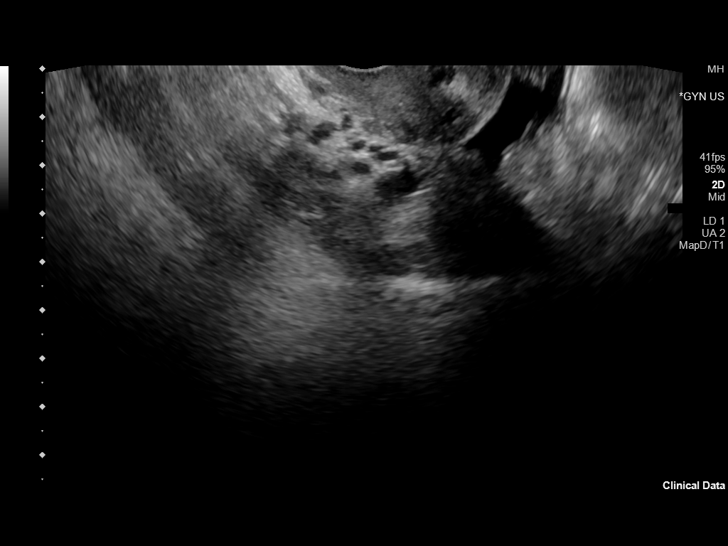
[im 93/102]
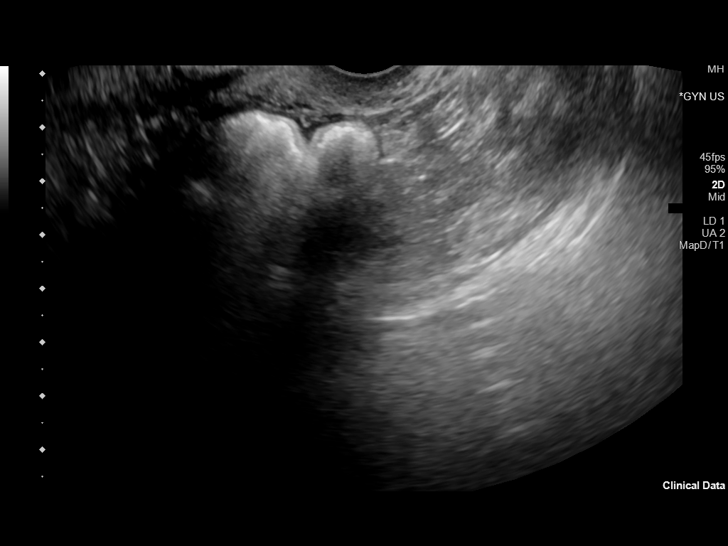
[im 102/102]
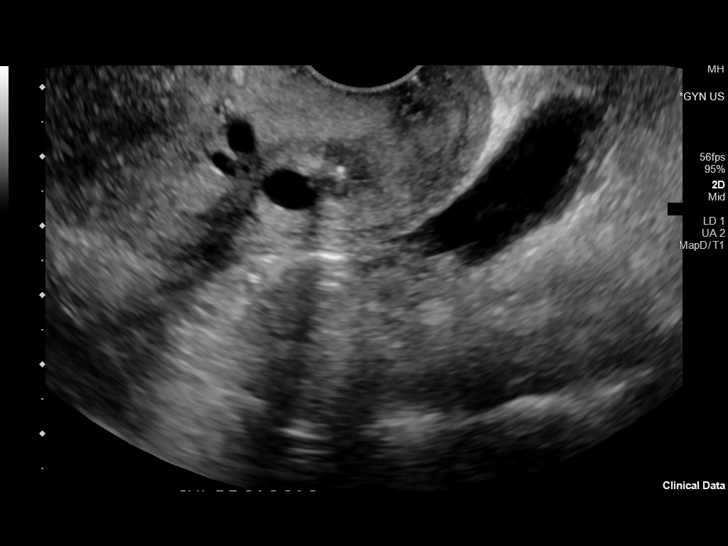

[13 of 25 positions shown; findings below may reference images not displayed]

FINDINGS: Uterus

Measurements: 8.4 x 3.6 x 5.3 cm = volume: 84 mL. Anteverted. Normal
morphology without mass. Nabothian cysts at cervix. Anterior wall
Caesarean section scar.

Endometrium

Thickness: 9 mm. Small cystic focus at site of Caesarean section
scar 7 x 6 x 9 mm. No additional endometrial abnormalities

Right ovary

Measurements: 3.0 x 2.1 x 2.1 cm = volume: 7 mL. Dominant follicle
without mass. Blood flow present within RIGHT ovary on color Doppler
imaging

Left ovary

Surgically absent

Pulsed Doppler evaluation demonstrates low resistance arterial and
venous waveforms in the RIGHT ovary. LEFT ovary is surgically
absent, not assessed by Doppler.

Other findings

Small amount of free pelvic fluid.  No adnexal masses.
IMPRESSION: Caesarean section scar anterior uterus.

Surgical absence of LEFT ovary.

No acute pelvic sonographic abnormalities.

## 2021-07-02 IMAGING — US US TRANSVAGINAL NON-OB
1 series · 13 of 25 positions shown · non-contrast
Comparison: 08/12/2013

CLINICAL DATA: Pelvic pain for a couple days, history of
endometriosis, prior LEFT ovarian excision, RIGHT salpingectomy,
Caesarean section; LMP 05/19/2019

EXAM:
TRANSABDOMINAL AND TRANSVAGINAL ULTRASOUND OF PELVIS
DOPPLER ULTRASOUND OF OVARIES
TECHNIQUE: Both transabdominal and transvaginal ultrasound examinations of the
pelvis were performed. Transabdominal technique was performed for
global imaging of the pelvis including uterus, ovaries, adnexal
regions, and pelvic cul-de-sac.
It was necessary to proceed with endovaginal exam following the
transabdominal exam to visualize the endometrium and RIGHT ovary.
Color and duplex Doppler ultrasound was utilized to evaluate blood
flow to the ovaries.

[Series 1: us transvaginal non-ob · 13 of 102 slices shown]
[im 1/102]
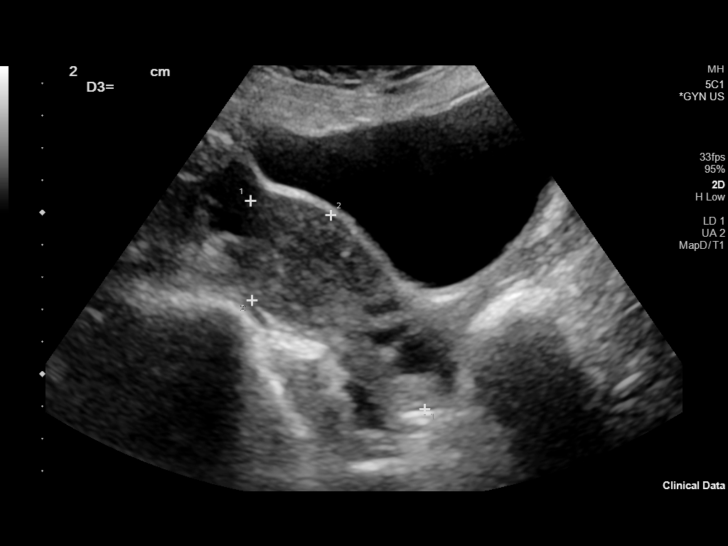
[im 9/102]
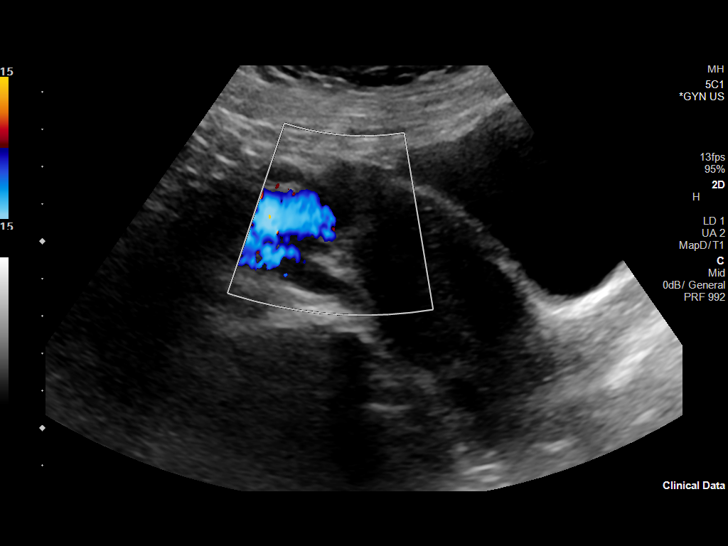
[im 17/102]
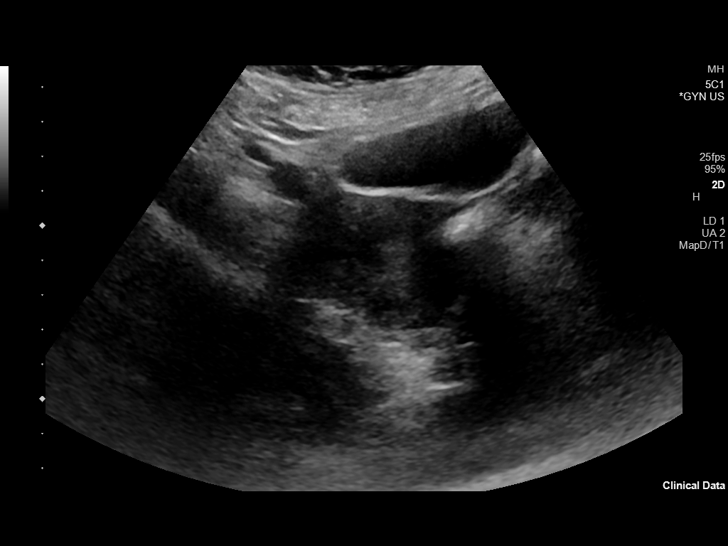
[im 26/102]
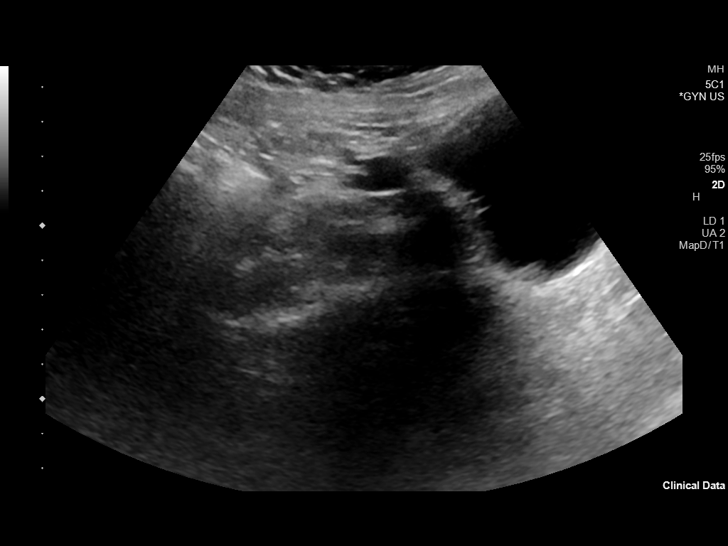
[im 34/102]
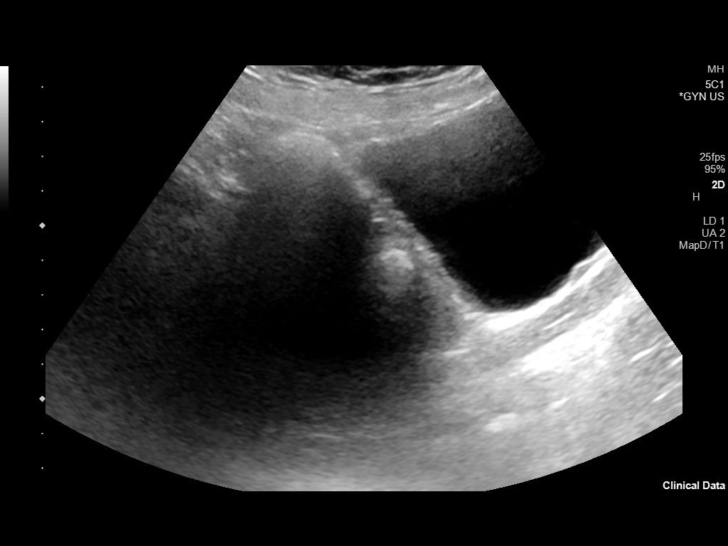
[im 43/102]
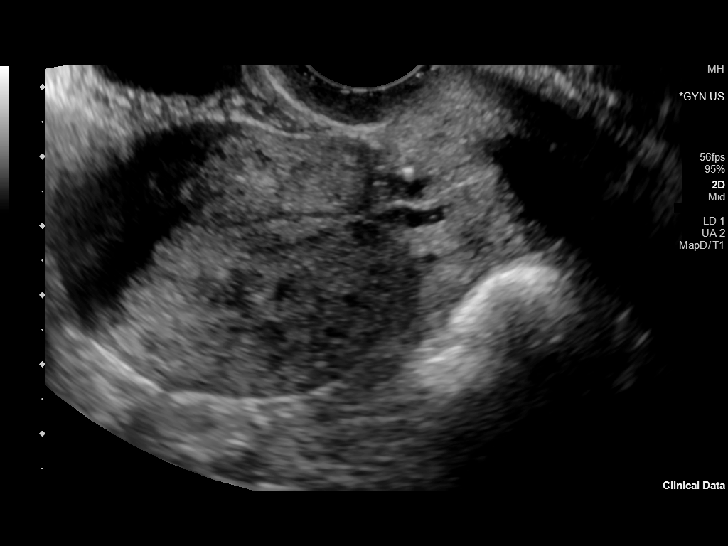
[im 51/102]
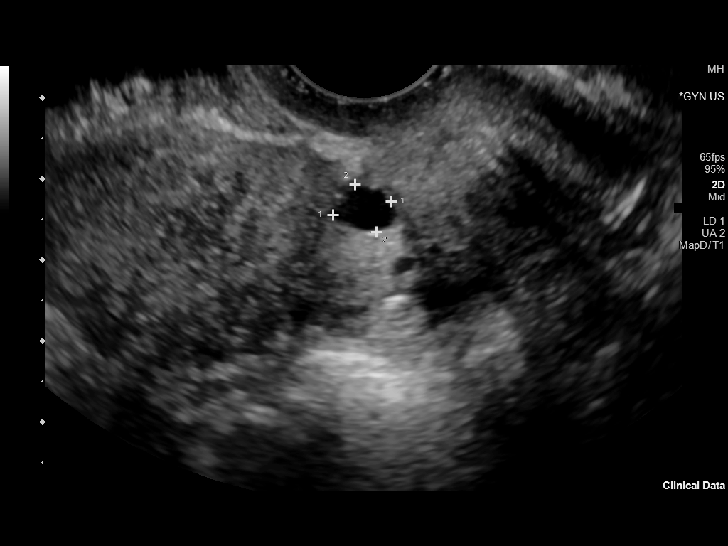
[im 59/102]
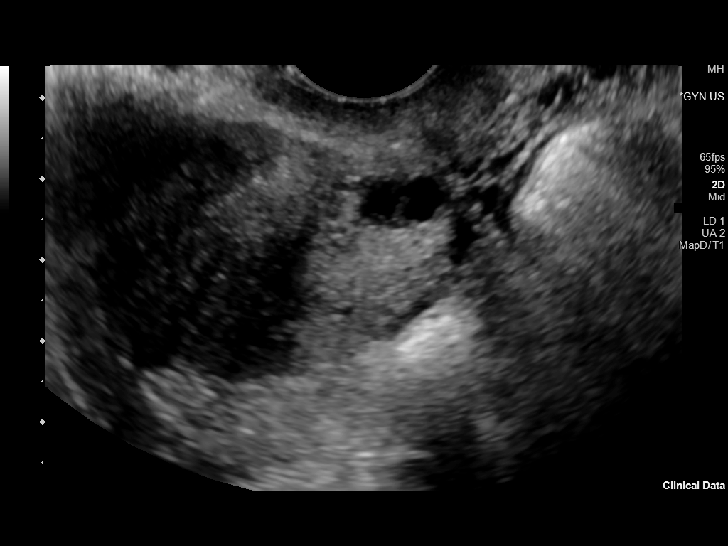
[im 68/102]
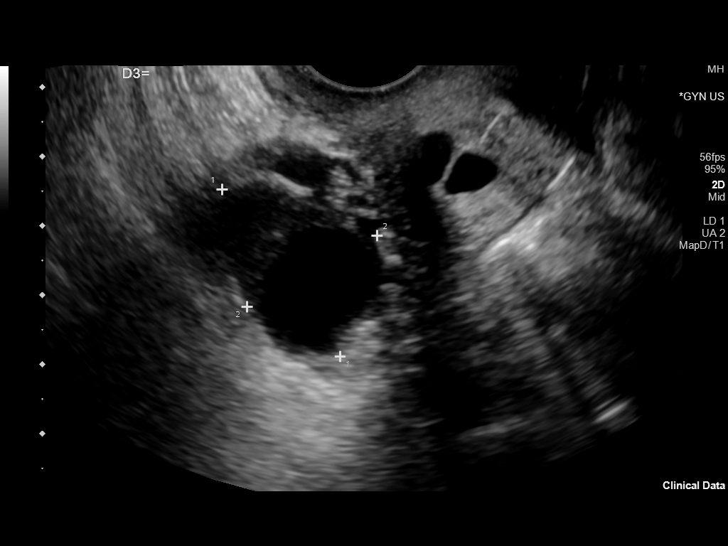
[im 76/102]
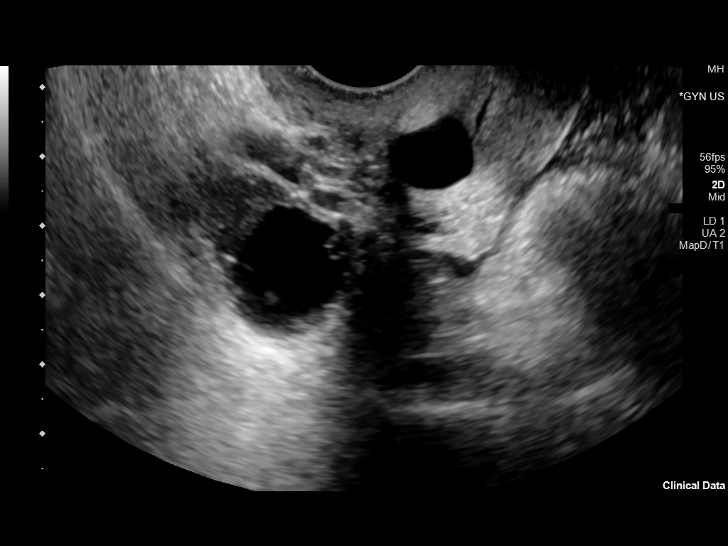
[im 85/102]
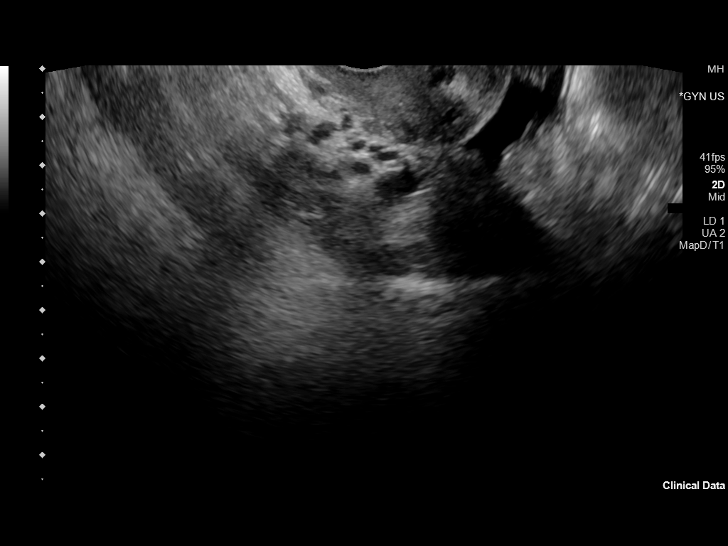
[im 93/102]
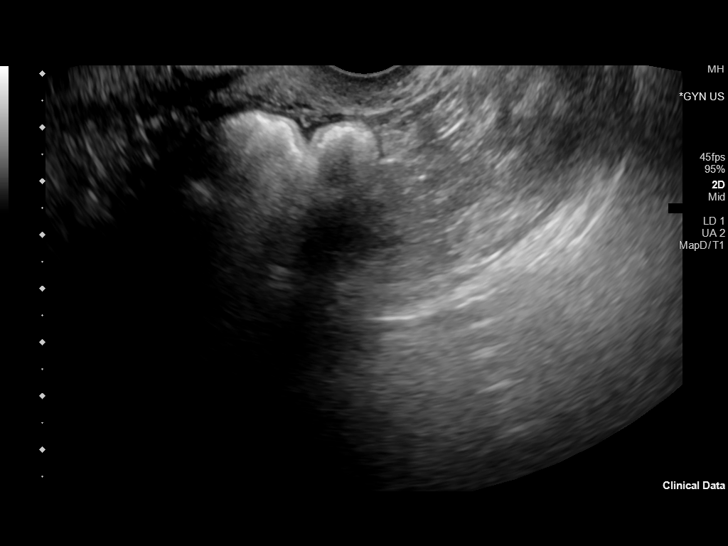
[im 102/102]
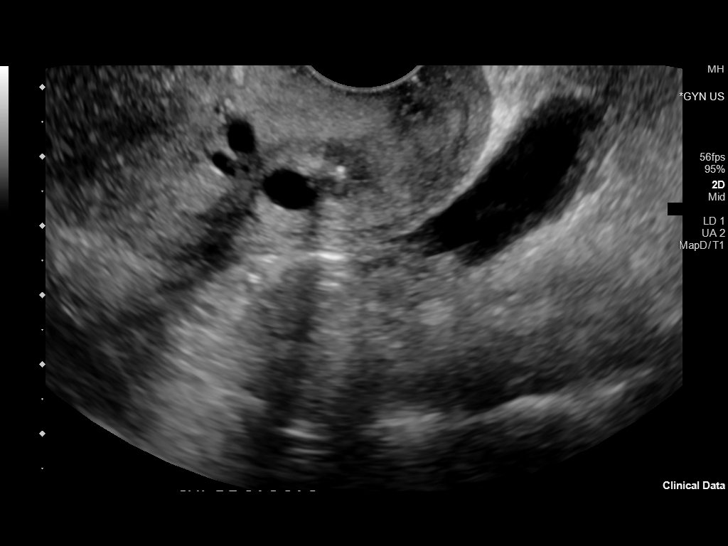

[13 of 25 positions shown; findings below may reference images not displayed]

FINDINGS: Uterus

Measurements: 8.4 x 3.6 x 5.3 cm = volume: 84 mL. Anteverted. Normal
morphology without mass. Nabothian cysts at cervix. Anterior wall
Caesarean section scar.

Endometrium

Thickness: 9 mm. Small cystic focus at site of Caesarean section
scar 7 x 6 x 9 mm. No additional endometrial abnormalities

Right ovary

Measurements: 3.0 x 2.1 x 2.1 cm = volume: 7 mL. Dominant follicle
without mass. Blood flow present within RIGHT ovary on color Doppler
imaging

Left ovary

Surgically absent

Pulsed Doppler evaluation demonstrates low resistance arterial and
venous waveforms in the RIGHT ovary. LEFT ovary is surgically
absent, not assessed by Doppler.

Other findings

Small amount of free pelvic fluid.  No adnexal masses.
IMPRESSION: Caesarean section scar anterior uterus.

Surgical absence of LEFT ovary.

No acute pelvic sonographic abnormalities.

## 2021-07-02 IMAGING — US US ART/VEN ABD/PELV/SCROTUM DOPPLER LTD
1 series · 13 of 25 positions shown · non-contrast
Comparison: 08/12/2013

CLINICAL DATA: Pelvic pain for a couple days, history of
endometriosis, prior LEFT ovarian excision, RIGHT salpingectomy,
Caesarean section; LMP 05/19/2019

EXAM:
TRANSABDOMINAL AND TRANSVAGINAL ULTRASOUND OF PELVIS
DOPPLER ULTRASOUND OF OVARIES
TECHNIQUE: Both transabdominal and transvaginal ultrasound examinations of the
pelvis were performed. Transabdominal technique was performed for
global imaging of the pelvis including uterus, ovaries, adnexal
regions, and pelvic cul-de-sac.
It was necessary to proceed with endovaginal exam following the
transabdominal exam to visualize the endometrium and RIGHT ovary.
Color and duplex Doppler ultrasound was utilized to evaluate blood
flow to the ovaries.

[Series 1: us art/ven abd/pelv/scrotum doppler ltd · 13 of 102 slices shown]
[im 1/102]
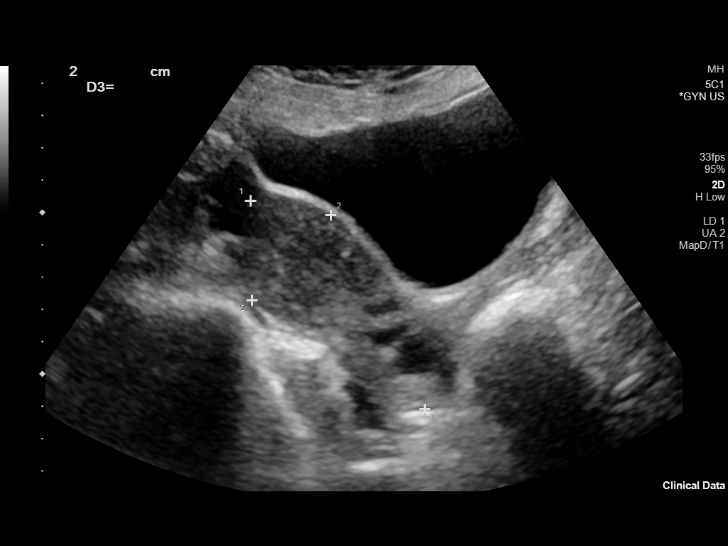
[im 9/102]
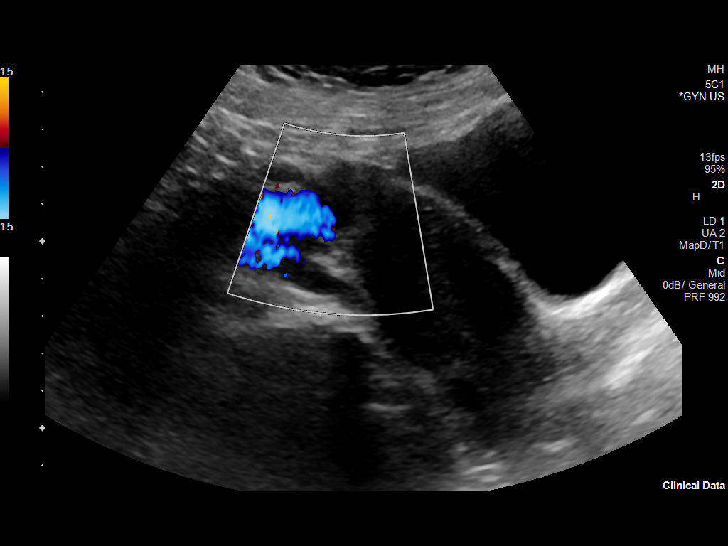
[im 17/102]
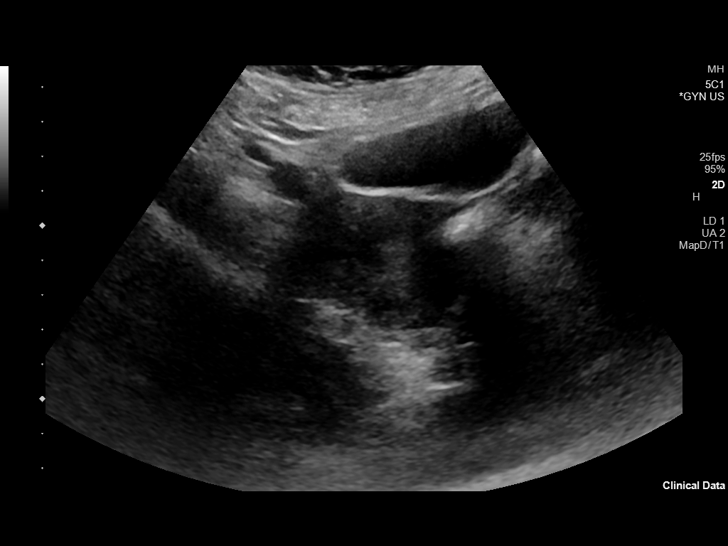
[im 26/102]
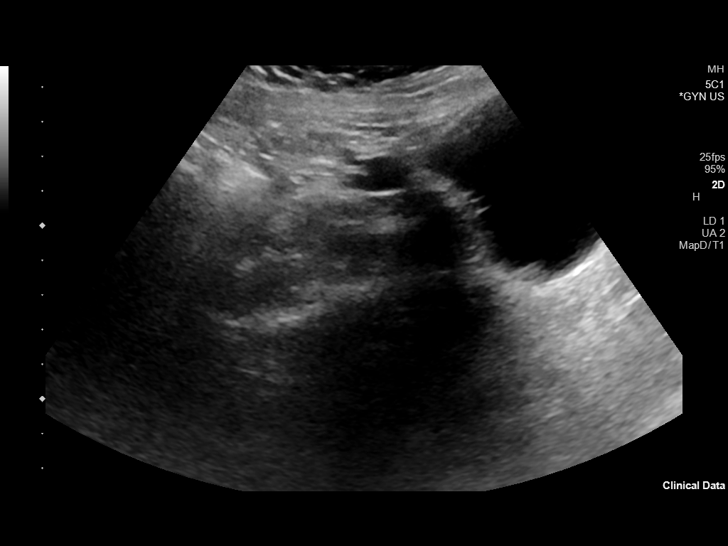
[im 34/102]
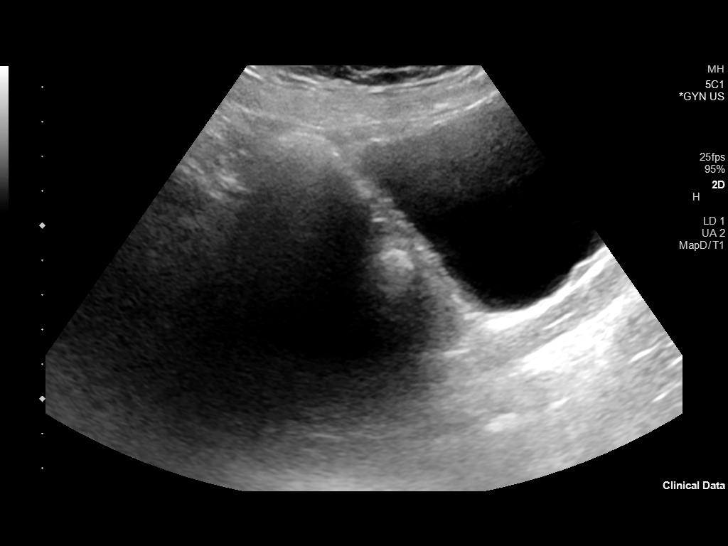
[im 43/102]
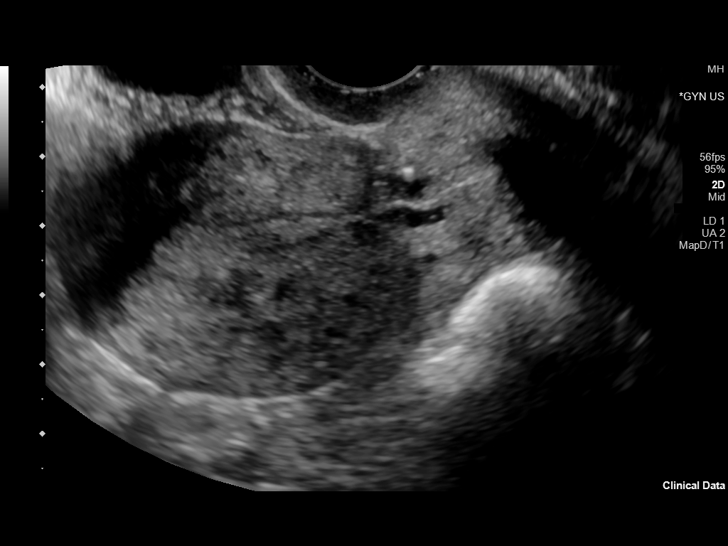
[im 51/102]
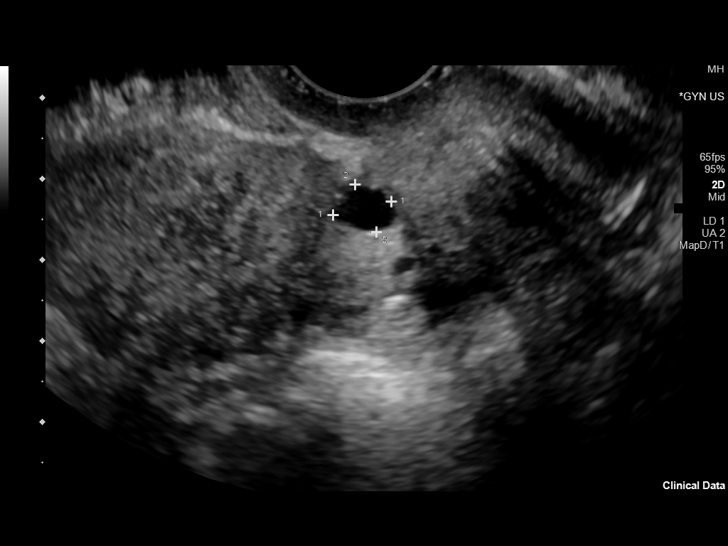
[im 59/102]
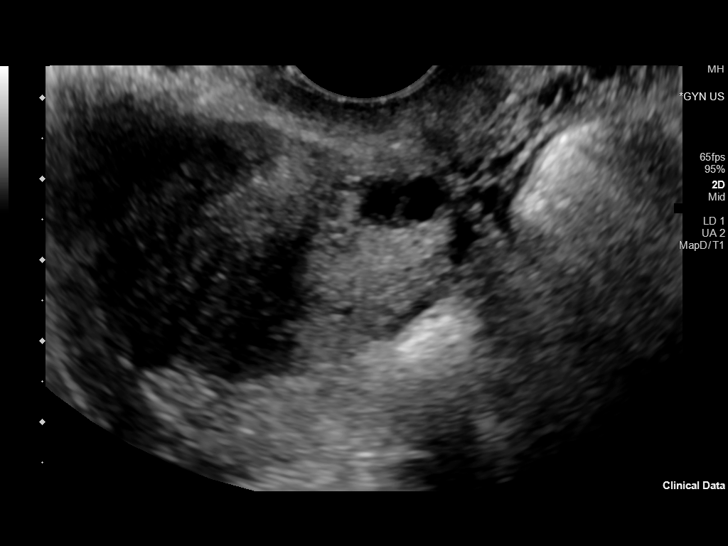
[im 68/102]
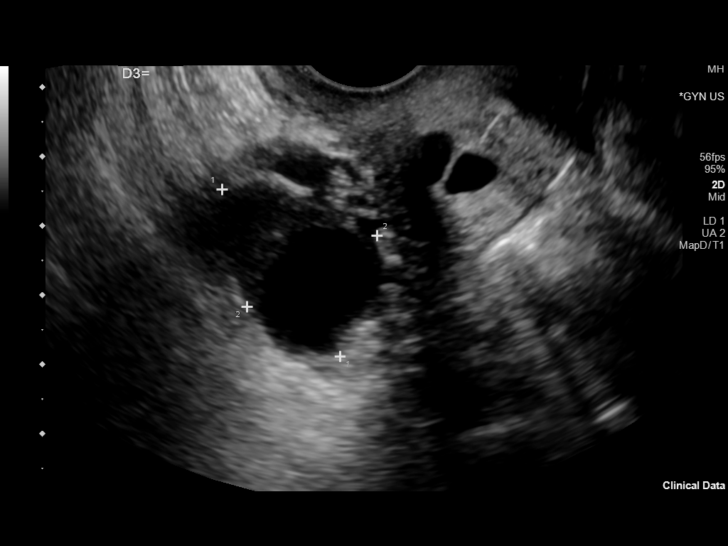
[im 76/102]
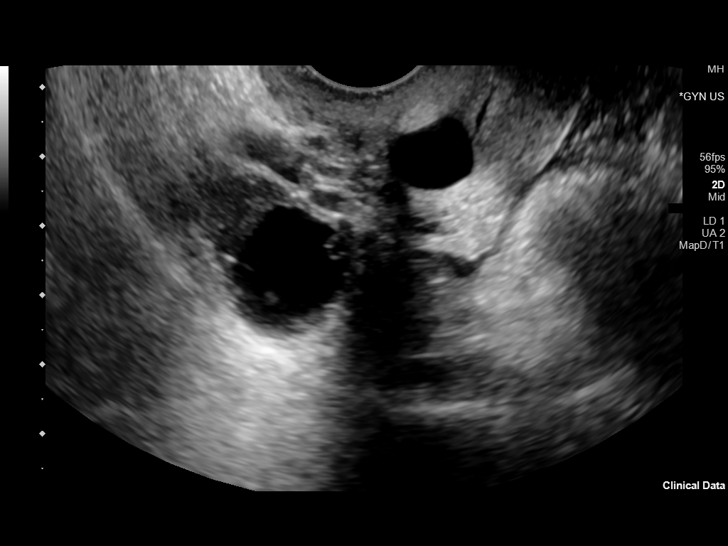
[im 85/102]
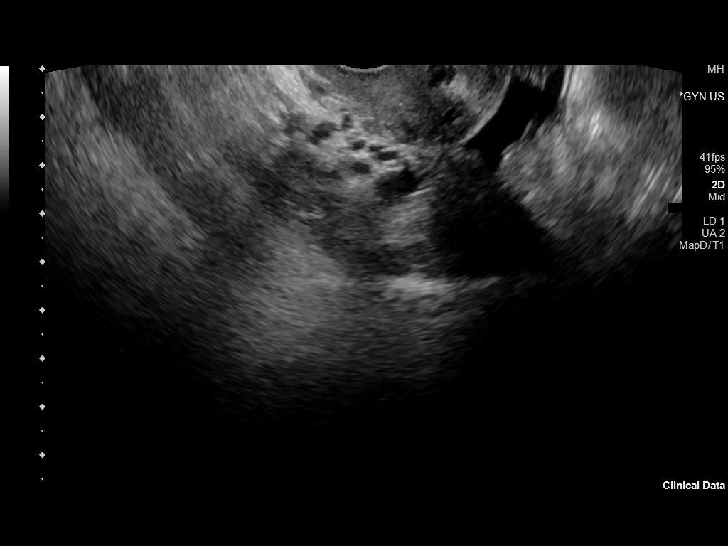
[im 93/102]
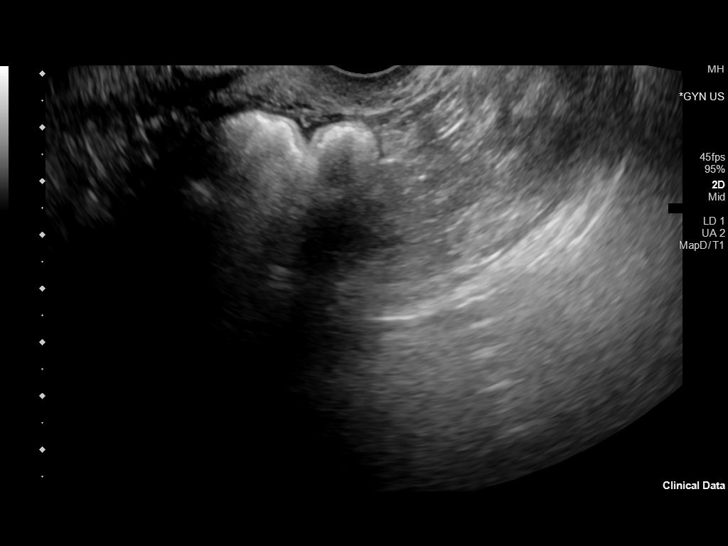
[im 102/102]
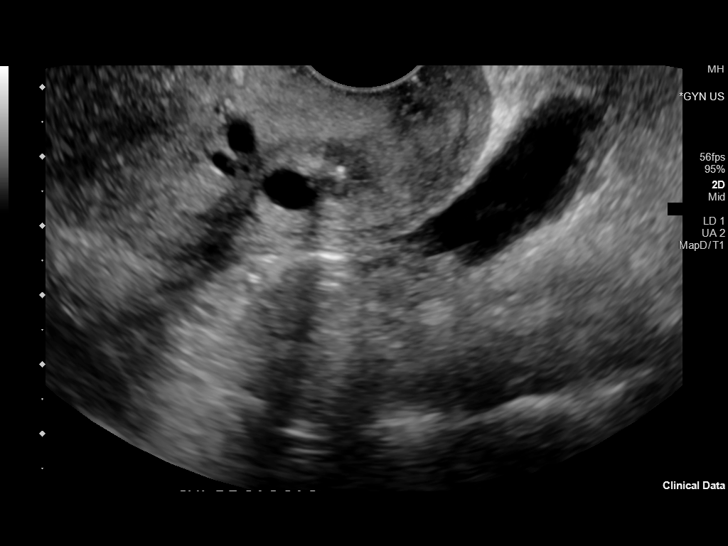

[13 of 25 positions shown; findings below may reference images not displayed]

FINDINGS: Uterus

Measurements: 8.4 x 3.6 x 5.3 cm = volume: 84 mL. Anteverted. Normal
morphology without mass. Nabothian cysts at cervix. Anterior wall
Caesarean section scar.

Endometrium

Thickness: 9 mm. Small cystic focus at site of Caesarean section
scar 7 x 6 x 9 mm. No additional endometrial abnormalities

Right ovary

Measurements: 3.0 x 2.1 x 2.1 cm = volume: 7 mL. Dominant follicle
without mass. Blood flow present within RIGHT ovary on color Doppler
imaging

Left ovary

Surgically absent

Pulsed Doppler evaluation demonstrates low resistance arterial and
venous waveforms in the RIGHT ovary. LEFT ovary is surgically
absent, not assessed by Doppler.

Other findings

Small amount of free pelvic fluid.  No adnexal masses.
IMPRESSION: Caesarean section scar anterior uterus.

Surgical absence of LEFT ovary.

No acute pelvic sonographic abnormalities.

## 2021-10-01 ENCOUNTER — Encounter: Payer: Self-pay | Admitting: Family Medicine

## 2021-10-01 ENCOUNTER — Ambulatory Visit (INDEPENDENT_AMBULATORY_CARE_PROVIDER_SITE_OTHER): Payer: Medicaid Other | Admitting: Family Medicine

## 2021-10-01 VITALS — BP 90/60 | HR 85 | Temp 98.6°F | Ht 60.0 in | Wt 129.4 lb

## 2021-10-01 DIAGNOSIS — F411 Generalized anxiety disorder: Secondary | ICD-10-CM

## 2021-10-01 DIAGNOSIS — M549 Dorsalgia, unspecified: Secondary | ICD-10-CM

## 2021-10-01 DIAGNOSIS — N3010 Interstitial cystitis (chronic) without hematuria: Secondary | ICD-10-CM

## 2021-10-01 NOTE — Patient Instructions (Addendum)
Roll back and pecs with tennis ball.  Diclofenac gel(voltaren gel)  Icey hot, menthol, etc.

## 2021-10-01 NOTE — Progress Notes (Signed)
New Patient Office Visit  Subjective:  Patient ID: Melissa Sellers, female    DOB: 1980/03/21  Age: 41 y.o. MRN: [id number]  CC:  Chief Complaint  Patient presents with   Establish Care    Need new pcp Back pain from previous fall Discuss heart murmur     HPI Melissa Sellers presents for new pt-June;s daughter  IC-seeing Dr. Alinda Money.  Doing well on celexa/xanax and hydrocodone-doing well but if changes anything, ends up in hospital.  Sens to meds/different brands, etc.  Generic lexapro not work.  Branded too expensive.  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 Lost 10# and hearing "woosh" in ears.  Intermitt murmur. 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  Step Dad-toxic. Very difficult situation.  Daughter living w/" narcissistic" ex-husband Severe back pain-sees pain doc.  [place] about 1 mo ago and twisted spine.  Some burning L post lower scapular area.  Feels more internal.  Cannot take NSAIDs due to interstitial cystitis Heart murmur-concerns.  A lot of panic attacks.  Pt disabled  needs handicapped parking form filled out   Past Medical History:  Diagnosis Date   Allergy    Anxiety    Arthritis    Bladder spasm    Depression    Endometriosis    Frequency of urination    Heart murmur    Herpes simplex    Hypertension    Interstitial cystitis    Migraines    Nocturia    Pelvic pain in female    Seizures (HCC)    Urgency of urination     Past Surgical History:  Procedure Laterality Date   AUGMENTATION MAMMAPLASTY  2009   IMPLANTS   CYSTO/ HOD/ INSTILLATION THERAPY  AUG 2011   ECTOPIC PREGNANCY SURGERY  2010   right salpingectomy   MULTIPLE LAPAROSCOPIES FOR ENDOMETRIOSIS  LAST ONE 2010   OOPHORECTOMY  2013   LSO   PELVIC LAPAROSCOPY     DL  LSO    Family History  Problem Relation Age of Onset   Heart disease Mother    Hyperlipidemia Mother    Hypertension Mother    Kidney disease Mother    Stroke Mother     Depression Mother    Arthritis Mother    Heart attack Mother    Hypertension Father    Hyperlipidemia Father    Heart disease Father    Early death Father    Heart attack Father    Cancer Father    Depression Father    Alcohol abuse Father    Arthritis Father    Arthritis Brother    Asthma Daughter    Stroke Maternal Grandmother    Drug abuse Maternal Grandfather    Alcohol abuse Maternal Grandfather    Heart attack Paternal Grandmother    Drug abuse Paternal Grandmother    Breast cancer Paternal Grandmother    Alcohol abuse Paternal Grandfather     Social History   Socioeconomic History   Marital status: Divorced    Spouse name: Not on file   Number of children: 1   Years of education: Not on file   Highest education level: Not on file  Occupational History   Not on file  Tobacco Use   Smoking status: Former    Packs/day: 0.15    Years: 8.00    Total pack years: 1.20    Types: Cigarettes   Smokeless tobacco: Never   Tobacco comments:  STATES HAS SMOKED A TOTAL OF 8 YRS--  OFF AND ON   Vaping Use   Vaping Use: Never used  Substance and Sexual Activity   Alcohol use: No   Drug use: Yes    Types: Amphetamines    Comment: pain pills   Sexual activity: Not Currently  Other Topics Concern   Not on file  Social History Narrative   Living w/Mom.  Daughter lives w/Dad.    Self employed-works on-line   Social Determinants of Radio broadcast assistant Strain: Not on file  Food Insecurity: Not on file  Transportation Needs: Not on file  Physical Activity: Not on file  Stress: Not on file  Social Connections: Not on file  Intimate Partner Violence: Not on file    ROS  ROS: Gen: no fever, chills  Skin: no rash, itching ENT: no ear pain, ear drainage, nasal congestion, rhinorrhea, sinus pressure, sore throat Eyes: no blurry vision, double vision Resp: no cough, wheeze,SOB CV: no CP, palpitations, LE edema, occ L arm numbness in hands/fingers since fell.   GI: no heartburn, n/v/d, abd pain.  Some chronic constipation.   GU: IC. Marland Kitchen  Has endometriosis-needs surgery.  A lot of cramps. Adhesions in abd from bowels.  MSK: chronic Neuro: intermitt dizziness when gets up at times.  Drinks plently of water.  And about 4 cups of 1/2 caf coffee.  Psych: chronic.  Objective:   Today's Vitals: BP 90/60   Pulse 85   Temp 98.6 F (37 C) (Temporal)   Ht 5' (1.524 m)   Wt 129 lb 6 oz (58.7 kg)   SpO2 98%   BMI 25.27 kg/m   Physical Exam  Gen: WDWN NAD-BP is not orthostatic. HEENT: NCAT, conjunctiva not injected, sclera nonicteric  NECK:  supple, no thyromegaly, no nodes, no carotid bruits CARDIAC: RRR, S1S2+, no murmur. DP 2+B LUNGS: CTAB. No wheezes ABDOMEN:  BS+, soft, diffusely tender, No HSM, no masses EXT:  no edema MSK: no gross abnormalities.  Positive muscle spasms left upper back.  Some tenderness to palpation left pecs. NEURO: A&O x3.  CN II-XII intact.  PSYCH: normal mood. Good eye contact   Assessment & Plan:   Problem List Items Addressed This Visit       Genitourinary   IC (interstitial cystitis) - Primary   Other Visit Diagnoses     Upper back pain on left side       GAD (generalized anxiety disorder)       Relevant Medications   citalopram (CELEXA) 20 MG tablet   ALPRAZolam (XANAX) 0.5 MG tablet     1.  Interstitial cystitis-chronic.  Not well controlled.  On chronic pain meds.  Managed by Dr. Amalia Hailey urologist. 2.  Generalized anxiety disorder-chronic.  Not ideal.  A lot of toxic stressors in her life.  She is currently on citalopram 20 mg.  She does get a specific generic brand of this.  Very sensitive to medications.  Has tried to get on branded Lexapro, however this is cost prohibitive.  She has a lot of drug interactions with the citalopram and would like to get off of it, however has been unable to.  She is also taking Xanax 0.5 mg daily 3.  Upper back pain-status post fall 1 month ago.  She is seeing pain  management for lower back pain, however they have not addressed her upper back.  Advised to try Voltaren gel (intolerant to NSAIDs) Tylenol, IcyHot, rolling on a tennis ball.  Consider physical therapy.  Follow-up if not improving.  Follow-up in 3 months, sooner if needed  Outpatient Encounter Medications as of 10/01/2021  Medication Sig   ALPRAZolam (XANAX) 0.5 MG tablet Take by mouth.   citalopram (CELEXA) 20 MG tablet Take 1 tablet by mouth daily.   HYDROcodone-acetaminophen (NORCO) 10-325 MG tablet Take 1 tablet by mouth every 6 (six) hours as needed.   loratadine (CLARITIN) 10 MG tablet Take by mouth.   [DISCONTINUED] citalopram (CELEXA) 20 MG tablet Take 20 mg by mouth at bedtime.    [DISCONTINUED] diazepam (VALIUM) 2 MG tablet Take 1 tablet (2 mg total) by mouth at bedtime.   [DISCONTINUED] metroNIDAZOLE (METROGEL VAGINAL) 0.75 % vaginal gel Place 1 Applicatorful vaginally 2 (two) times daily.   [DISCONTINUED] traMADol (ULTRAM) 50 MG tablet Take 1 tablet (50 mg total) by mouth every 6 (six) hours as needed.   No facility-administered encounter medications on file as of 10/01/2021.    Follow-up: Return in about 3 months (around 12/31/2021) for shoulder pain.   Wellington Hampshire, MD

## 2022-02-25 ENCOUNTER — Ambulatory Visit: Payer: Medicaid Other | Admitting: Family Medicine

## 2022-02-25 ENCOUNTER — Encounter: Payer: Self-pay | Admitting: Family Medicine

## 2022-02-25 VITALS — BP 120/70 | HR 81 | Temp 98.7°F | Ht 60.0 in | Wt 123.4 lb

## 2022-02-25 DIAGNOSIS — F411 Generalized anxiety disorder: Secondary | ICD-10-CM

## 2022-02-25 MED ORDER — ALPRAZOLAM 0.5 MG PO TABS: 0.5000 mg | ORAL_TABLET | Freq: Every evening | ORAL | 0 refills | Status: AC | PRN

## 2022-02-25 NOTE — Patient Instructions (Signed)
It was very nice to see you today!  Good Luck.  Call Dr. Amalia Hailey   PLEASE NOTE:  If you had any lab tests please let us know if you have not heard back within a few days. You may see your results on MyChart before we have a chance to review them but we will give you a call once they are reviewed by [place]. If we ordered any referrals today, please let us know if you have not heard from their office within the next week.   Please try these tips to maintain a healthy lifestyle:  Eat most of your calories during the day when you are active. Eliminate processed foods including packaged sweets (pies, cakes, cookies), reduce intake of potatoes, white bread, white pasta, and white rice. Look for whole grain options, oat flour or almond flour.  Each meal should contain half fruits/vegetables, one quarter protein, and one quarter carbs (no bigger than a computer mouse).  Cut down on sweet beverages. This includes juice, soda, and sweet tea. Also watch fruit intake, though this is a healthier sweet option, it still contains natural sugar! Limit to 3 servings daily.  Drink at least 1 glass of water with each meal and aim for at least 8 glasses per day  Exercise at least 150 minutes every week.

## 2022-02-25 NOTE — Progress Notes (Signed)
Subjective:     Patient ID: Melissa Sellers, female    DOB: December 19, 1980, 42 y.o.   MRN: [id number]  Chief Complaint  Patient presents with   Follow-up    3 month follow-up for shoulder pain and to discuss medications Out of Xanax, need a refill    HPI  Anxiety-on Xanax from Dr. Alinda Money.  Taking celexa daily and xanax nightly. These meds work well in conjunction w/each other and no issues. Pt missed appt on 2/5-severe ha.  So xanax not refilled.  Can't reach him-has left messages.  Ran out Monday-sweating, anxiety, shakey,vertigo, poor appetite so losing wt. Has been in ER in past w/withdrawals, etc. A lot going on.   ST-no set plan. Takes hydrocodone q3hrs(41m) Chronic back pain GI flare up-adhesions, endometriosis-supposed to get surgery.   Health Maintenance Due  Topic Date Due   HIV Screening  Never done   Hepatitis C Screening  Never done   DTaP/Tdap/Td (1 - Tdap) Never done   PAP SMEAR-Modifier  07/21/2013    Past Medical History:  Diagnosis Date   Allergy    Anxiety    Arthritis    Bladder spasm    Depression    Endometriosis    Frequency of urination    Heart murmur    Herpes simplex    Hypertension    Interstitial cystitis    Migraines    Nocturia    Pelvic pain in female    Seizures (HCC)    Urgency of urination     Past Surgical History:  Procedure Laterality Date   AUGMENTATION MAMMAPLASTY  2009   IMPLANTS   CYSTO/ HOD/ INSTILLATION THERAPY  AUG 2011   ECTOPIC PREGNANCY SURGERY  2010   right salpingectomy   MULTIPLE LAPAROSCOPIES FOR ENDOMETRIOSIS  LAST ONE 2010   OOPHORECTOMY  2013   LSO   PELVIC LAPAROSCOPY     DL  LSO    Outpatient Medications Prior to Visit  Medication Sig Dispense Refill   ALPRAZolam (XANAX) 0.5 MG tablet Take by mouth.     citalopram (CELEXA) 20 MG tablet Take 1 tablet by mouth daily.     HYDROcodone-acetaminophen (NORCO/VICODIN) 5-325 MG tablet Take 2 tablets by mouth every 6 (six) hours as needed.     loratadine  (CLARITIN) 10 MG tablet Take by mouth.     HYDROcodone-acetaminophen (NORCO) 10-325 MG tablet Take 1 tablet by mouth every 6 (six) hours as needed. 10 tablet 0   No facility-administered medications prior to visit.    Allergies  Allergen Reactions   Diazepam Swelling    Tongue/throat   Amitriptyline Other (See Comments)    MUSCLE SPASMS   Ciprofloxacin Hives    Other reaction(s): Other (See Comments) Flu like sx's    Codeine Other (See Comments)    Itching, hives,    Doxycycline Other (See Comments)    "flu-like symptoms."     Fluconazole     Interacts with Celexa Other reaction(s): Other (See Comments) Interacts with Celexa   Penicillins Hives    Did it involve swelling of the face/tongue/throat, SOB, or low BP? Unknown Did it involve sudden or severe rash/hives, skin peeling, or any reaction on the inside of your mouth or nose? No Did you need to seek medical attention at a hospital or doctor's office? Y When did it last happen? infant      If all above answers are "NO", may proceed with cephalosporin use.     Shellfish Allergy Hives and  Swelling   Sulfa Antibiotics Other (See Comments)    SEIZURES   Sulfamethoxazole Hives   Flagyl [Metronidazole Hcl] Rash   Latex Rash and Other (See Comments)    No latex use since age 66. Yeast infection with condom use.   Metoclopramide Other (See Comments), Nausea And Vomiting, Nausea Only, Palpitations and Photosensitivity    Other reaction(s): Confusion (intolerance), Cramps (ALLERGY/intolerance), Sweating (intolerance)   Tramadol Other (See Comments), Anxiety, Diarrhea, Swelling, Palpitations, Nausea And Vomiting, Nausea Only, Photosensitivity and Tinitus    Muscle spasms Other reaction(s): Arthralgias (intolerance), Bladder Spasms (intolerance), Chills (intolerance), Confusion (intolerance), Cramps (ALLERGY/intolerance), Delusions (intolerance), Diaphoresis / Sweating (intolerance), Dizziness (intolerance), Facial Edema  (intolerance), Fever (intolerance), Flushing (ALLERGY/intolerance), Generalized Edema (intolerance), GI Upset (intolerance), Hypertension (intolerance), Lethargy (intolerance), Malaise (intolerance), Mental Status Changes (intolerance), Myalgias (intolerance), Psychosis (intolerance), Sweating (intolerance), Tremor (intolerance)   ROS neg/noncontributory except as noted HPI/below      Objective:     BP 120/70   Pulse 81   Temp 98.7 F (37.1 C) (Temporal)   Ht 5' (1.524 m)   Wt 123 lb 6 oz (56 kg)   SpO2 99%   BMI 24.10 kg/m  Wt Readings from Last 3 Encounters:  02/25/22 123 lb 6 oz (56 kg)  10/01/21 129 lb 6 oz (58.7 kg)  04/25/21 130 lb 1.1 oz (59 kg)    Physical Exam   Gen: WDWN NAD HEENT: NCAT, conjunctiva not injected, sclera nonicteric NECK:  supple, no thyromegaly, no nodes, no carotid bruits CARDIAC: RRR, S1S2+, no murmur. NEURO: A&O x3.  CN II-XII intact.  PSYCH: normal mood. Good eye contact. tearful     Assessment & Plan:   Problem List Items Addressed This Visit   None Visit Diagnoses     GAD (generalized anxiety disorder)    -  Primary      GAD-usu in balance with xanax, hydro and celexa.  Out of xanax.  PDMP checked.  Working on getting Dr. Amalia Hailey to send in, but decompensating.  Has appt on 2/20 w/him.  Discussed mult docs managing.  Needs to get him to write but will do 7 tabs to bridge xanax 0.25m.   No orders of the defined types were placed in this encounter.   AWellington Hampshire MD

## 2022-05-26 ENCOUNTER — Ambulatory Visit: Payer: Medicaid Other | Admitting: Family Medicine

## 2022-06-01 ENCOUNTER — Ambulatory Visit: Payer: Medicaid Other | Admitting: Family Medicine

## 2022-06-22 ENCOUNTER — Ambulatory Visit: Payer: Medicaid Other | Admitting: Family Medicine

## 2023-01-11 ENCOUNTER — Ambulatory Visit: Payer: Medicaid Other | Admitting: Family Medicine

## 2023-01-11 VITALS — BP 110/80 | HR 83 | Temp 98.1°F | Ht 60.0 in | Wt 141.2 lb

## 2023-01-11 DIAGNOSIS — N3010 Interstitial cystitis (chronic) without hematuria: Secondary | ICD-10-CM | POA: Diagnosis not present

## 2023-01-11 DIAGNOSIS — R102 Pelvic and perineal pain: Secondary | ICD-10-CM

## 2023-01-11 NOTE — Patient Instructions (Signed)
It was very nice to see you today!  Get appt w/Dr. Evelene Croon  Will refer to [facility] pain mgmt  Check medicaid for pain mgmt docs.

## 2023-01-11 NOTE — Progress Notes (Signed)
Subjective:     Patient ID: Melissa Sellers, female    DOB: Aug 08, 1980, 42 y.o.   MRN: [id number]  Chief Complaint  Patient presents with   Medication Problem    Pt is having trouble with pain medication adjustment.    HPI-here w/dau  IC-chronic pelvic pain.  On hydrocodone.  Managed by urol Dr. Logan Bores.  Weaning off the hydro.  Pt not doing well   since switched to citalopram, harder to control.  Very sensitive to meds and any med changes.  Has been hospitalized w/seratonin syndrome w/codeine and tramadol.  Nausea/vertigo, skin burning, heart pounding, tinnitis, not doing well.  Has not been able to function.  Tapering hydrocodone.  Wants to see pain mgmt doc.  This combo of hydro,celexa,xanax keeps her "sane".    if anything is adjusted, can't function.  So with decrease of hydro, brain not functioning properly.  Brain zaps. Has been on for 71yrs. Supposed to take 3 hydro/day, but symptoms so bad, will take 4th and then runs out. Tried to taper celexa in August-vision impaired, vertigo, ha, leg muscles locking and neck, nausea, diarrhea, severe back pain, toe nial pain, GERD, feverish, thinking impaired, loss of appetite, insomnia, shaking body.  Now, since cydro decreased "jumping awake, severe back pain, coordination off, blurry bision nausea, vertigo, crying, heart pounding and "whooshing, memory, lucid dreams,gurgling in throat, tinnitis.  Heavier menstrual bleeding when gets.   Was told if psych agrees that this is the course, then he can rx, but...   she wants to see Dr. Helane Rima, but will cost $350.  2.  RBC low but MCV high.  Taking B12 daily  and D and iron q 3-4 days.   Health Maintenance Due  Topic Date Due   HIV Screening  Never done   Hepatitis C Screening  Never done   Cervical Cancer Screening (HPV/Pap Cotest)  07/22/2015    Past Medical History:  Diagnosis Date   Allergy    Anxiety    Arthritis    Bladder spasm    Depression    Endometriosis    Frequency of urination     Heart murmur    Herpes simplex    Hypertension    Interstitial cystitis    Migraines    Nocturia    Pelvic pain in female    Seizures (HCC)    Urgency of urination     Past Surgical History:  Procedure Laterality Date   AUGMENTATION MAMMAPLASTY  2009   IMPLANTS   CYSTO/ HOD/ INSTILLATION THERAPY  AUG 2011   ECTOPIC PREGNANCY SURGERY  2010   right salpingectomy   MULTIPLE LAPAROSCOPIES FOR ENDOMETRIOSIS  LAST ONE 2010   OOPHORECTOMY  2013   LSO   PELVIC LAPAROSCOPY     DL  LSO     Current Outpatient Medications:    ALPRAZolam (XANAX) 0.5 MG tablet, Take 1 tablet (0.5 mg total) by mouth at bedtime as needed for anxiety., Disp: 7 tablet, Rfl: 0   citalopram (CELEXA) 20 MG tablet, Take 1 tablet by mouth daily., Disp: , Rfl:    estradiol (ESTRACE) 1 MG tablet, Take 1 mg by mouth daily., Disp: , Rfl:    HYDROcodone-acetaminophen (NORCO) 10-325 MG tablet, Take 1 tablet by mouth every 8 (eight) hours as needed., Disp: , Rfl:   Allergies  Allergen Reactions   Diazepam Swelling    Tongue/throat   Amitriptyline Other (See Comments)    MUSCLE SPASMS   Ciprofloxacin Hives    Other  reaction(s): Other (See Comments) Flu like sx's    Codeine Other (See Comments)    Itching, hives,    Doxycycline Other (See Comments)    "flu-like symptoms."     Fluconazole     Interacts with Celexa Other reaction(s): Other (See Comments) Interacts with Celexa   Penicillins Hives    Did it involve swelling of the face/tongue/throat, SOB, or low BP? Unknown Did it involve sudden or severe rash/hives, skin peeling, or any reaction on the inside of your mouth or nose? No Did you need to seek medical attention at a hospital or doctor's office? Y When did it last happen? infant      If all above answers are "NO", may proceed with cephalosporin use.     Shellfish Allergy Hives and Swelling   Sulfa Antibiotics Other (See Comments)    SEIZURES   Sulfamethoxazole Hives   Flagyl [Metronidazole Hcl]  Rash   Latex Rash and Other (See Comments)    No latex use since age 7. Yeast infection with condom use.   Metoclopramide Other (See Comments), Nausea And Vomiting, Nausea Only, Palpitations and Photosensitivity    Other reaction(s): Confusion (intolerance), Cramps (ALLERGY/intolerance), Sweating (intolerance)   Tramadol Other (See Comments), Anxiety, Diarrhea, Swelling, Palpitations, Nausea And Vomiting, Nausea Only, Photosensitivity and Tinitus    Muscle spasms Other reaction(s): Arthralgias (intolerance), Bladder Spasms (intolerance), Chills (intolerance), Confusion (intolerance), Cramps (ALLERGY/intolerance), Delusions (intolerance), Diaphoresis / Sweating (intolerance), Dizziness (intolerance), Facial Edema (intolerance), Fever (intolerance), Flushing (ALLERGY/intolerance), Generalized Edema (intolerance), GI Upset (intolerance), Hypertension (intolerance), Lethargy (intolerance), Malaise (intolerance), Mental Status Changes (intolerance), Myalgias (intolerance), Psychosis (intolerance), Sweating (intolerance), Tremor (intolerance)   ROS neg/noncontributory except as noted HPI/below      Objective:     BP 110/80 (BP Location: Left Arm, Patient Position: Sitting, Cuff Size: Normal)   Pulse 83   Temp 98.1 F (36.7 C) (Temporal)   Ht 5' (1.524 m)   Wt 141 lb 4 oz (64.1 kg)   LMP 11/17/2022 (Approximate)   SpO2 97%   BMI 27.59 kg/m  Wt Readings from Last 3 Encounters:  01/11/23 141 lb 4 oz (64.1 kg)  02/25/22 123 lb 6 oz (56 kg)  10/01/21 129 lb 6 oz (58.7 kg)    Physical Exam   Gen: WDWN NAD HEENT: NCAT, conjunctiva not injected, sclera nonicteric NECK:  supple, no thyromegaly, no nodes, no carotid bruits CARDIAC: RRR, S1S2+, no murmur.  EXT:  no edema MSK: no gross abnormalities.  NEURO: A&O x3.  CN II-XII intact.  PSYCH: normal mood. Good eye contact  Reviewed urol notes-25min w/pt and reviewing notes.    Assessment & Plan:  IC (interstitial cystitis)  Pelvic  pain  IC and chronic pelvic pain and back pain.  Was managed on hydrocodone, but urol weaning as no strong indication.  Pt states overall doesn't do well if not on it and throws everything else off balance.  Requesting referral to pain mgmt.  I can't take over pain mgmt.    Lab abn-pt will sch f/u appt Return for soon for anemia, etc. .  Angelena Sole, MD

## 2023-01-15 ENCOUNTER — Telehealth: Payer: Self-pay | Admitting: Family Medicine

## 2023-01-15 NOTE — Telephone Encounter (Unsigned)
 Copied from CRM [id number]. Topic: Referral - Status >> Jan 15, 2023  4:10 PM Thersia BROCKS wrote: Reason for CRM: Patient called in wanting to know the status of the pain management referral and wanted to know when that will be sent Dr.Maggie Jerrye [phone] . Patient is requesting a callback when sent to physician

## 2023-01-25 ENCOUNTER — Ambulatory Visit: Payer: Medicaid Other | Admitting: Family Medicine

## 2023-01-27 ENCOUNTER — Ambulatory Visit: Payer: Medicaid Other | Admitting: Family Medicine

## 2023-01-29 DIAGNOSIS — N3010 Interstitial cystitis (chronic) without hematuria: Secondary | ICD-10-CM | POA: Diagnosis not present

## 2023-01-29 DIAGNOSIS — N809 Endometriosis, unspecified: Secondary | ICD-10-CM | POA: Diagnosis not present

## 2023-01-29 DIAGNOSIS — Z79899 Other long term (current) drug therapy: Secondary | ICD-10-CM | POA: Diagnosis not present

## 2023-02-02 ENCOUNTER — Encounter: Payer: Self-pay | Admitting: Family Medicine

## 2023-02-02 ENCOUNTER — Ambulatory Visit: Payer: Medicaid Other | Admitting: Family Medicine

## 2023-02-02 VITALS — BP 128/81 | HR 90 | Temp 98.1°F | Resp 16 | Ht 60.0 in | Wt 143.1 lb

## 2023-02-02 DIAGNOSIS — D649 Anemia, unspecified: Secondary | ICD-10-CM

## 2023-02-02 DIAGNOSIS — R519 Headache, unspecified: Secondary | ICD-10-CM | POA: Diagnosis not present

## 2023-02-02 DIAGNOSIS — R5383 Other fatigue: Secondary | ICD-10-CM | POA: Diagnosis not present

## 2023-02-02 DIAGNOSIS — G43009 Migraine without aura, not intractable, without status migrainosus: Secondary | ICD-10-CM

## 2023-02-02 MED ORDER — NURTEC 75 MG PO TBDP: 75.0000 mg | ORAL_TABLET | ORAL | 3 refills | Status: DC

## 2023-02-02 NOTE — Progress Notes (Signed)
Subjective:     Patient ID: Melissa Sellers, female    DOB: 01-30-1980, 43 y.o.   MRN: [id number]  Chief Complaint  Patient presents with   Anemia    Follow-up on anemia Heavy bleeding that started 4 days ago with big clots   Headache    Migraine last 3 days, vertigo, balance off for a while now 2 days ago right side of head went numb for a couple of hours, never happened before    HPI Discussed the use of AI scribe software for clinical note transcription with the patient, who gave verbal consent to proceed.  History of Present Illness   The patient, with a history of anemia, presents with concerns about persistent abnormal complete blood count (CBC) results, specifically low red blood cell count and high mean corpuscular volume (MCV). She reports a long-standing issue with anemia, which she has been attempting to manage with sporadic iron and B12 supplementation. The patient also mentions a recent introduction of a new supplement, Triquenta, which contains B12 and B6.  The patient reports heavy menstrual periods, some lasting up to 17-19 days, which have resulted in hospital visits due to pain. She describes an abnormal watery discharge during these prolonged periods. The patient also mentions a history of headaches, which she believes are related to her menstrual cycle. She describes these headaches as debilitating and typically lasting about three days. Recently, the patient experienced a new symptom of half her head going numb, which she believes may be related to her migraines.  The patient is currently experiencing significant distress due to issues with her medication. She reports feeling like she is losing her mind, having difficulty reading, and struggling with balance and coordination. She describes these symptoms as an out-of-body experience and compares it to fever dreams. The patient also reports frequent vertigo, occurring daily and lasting for hours. She describes a roaring  sound in her ears that comes and goes, sometimes being so loud it is disruptive.  The patient has a history of being on Celexa, which she believes is causing significant side effects and interactions with other medications. She expresses fear about the potential for serotonin syndrome. She also mentions a history of being on Lexapro, which she had to switch from due to insurance issues. The patient is currently taking Estrace daily, which she believes may be contributing to her heavy menstrual periods.  The patient is under significant stress due to her living situation and is struggling to manage her health issues while also caring for her daughter. She expresses frustration with her current healthcare providers and feels stuck in her current situation. She is seeking help to manage her anemia, menstrual issues, migraines, and medication side effects.      Things didn't go well w/new pain mgmt doctor.    Health Maintenance Due  Topic Date Due   HIV Screening  Never done   Hepatitis C Screening  Never done   Cervical Cancer Screening (HPV/Pap Cotest)  07/22/2015    Past Medical History:  Diagnosis Date   Allergy    Anxiety    Arthritis    Bladder spasm    Depression    Endometriosis    Frequency of urination    Heart murmur    Herpes simplex    Hypertension    Interstitial cystitis    Migraines    Nocturia    Pelvic pain in female    Seizures (HCC)    Urgency of urination  Past Surgical History:  Procedure Laterality Date   AUGMENTATION MAMMAPLASTY  2009   IMPLANTS   CYSTO/ HOD/ INSTILLATION THERAPY  AUG 2011   ECTOPIC PREGNANCY SURGERY  2010   right salpingectomy   MULTIPLE LAPAROSCOPIES FOR ENDOMETRIOSIS  LAST ONE 2010   OOPHORECTOMY  2013   LSO   PELVIC LAPAROSCOPY     DL  LSO     Current Outpatient Medications:    ALPRAZolam (XANAX) 0.5 MG tablet, Take 1 tablet (0.5 mg total) by mouth at bedtime as needed for anxiety., Disp: 7 tablet, Rfl: 0   citalopram  (CELEXA) 20 MG tablet, Take 1 tablet by mouth daily., Disp: , Rfl:    estradiol (ESTRACE) 1 MG tablet, Take 1 mg by mouth daily., Disp: , Rfl:    HYDROcodone-acetaminophen (NORCO) 10-325 MG tablet, Take by mouth., Disp: , Rfl:    Rimegepant Sulfate (NURTEC) 75 MG TBDP, Take 1 tablet (75 mg total) by mouth every other day., Disp: 16 tablet, Rfl: 3  Allergies  Allergen Reactions   Diazepam Swelling    Tongue/throat   Amitriptyline Other (See Comments)    MUSCLE SPASMS   Ciprofloxacin Hives    Other reaction(s): Other (See Comments) Flu like sx's    Codeine Other (See Comments)    Itching, hives,    Doxycycline Other (See Comments)    "flu-like symptoms."     Fluconazole     Interacts with Celexa Other reaction(s): Other (See Comments) Interacts with Celexa   Penicillins Hives    Did it involve swelling of the face/tongue/throat, SOB, or low BP? Unknown Did it involve sudden or severe rash/hives, skin peeling, or any reaction on the inside of your mouth or nose? No Did you need to seek medical attention at a hospital or doctor's office? Y When did it last happen? infant      If all above answers are "NO", may proceed with cephalosporin use.     Shellfish Allergy Hives and Swelling   Sulfa Antibiotics Other (See Comments)    SEIZURES   Sulfamethoxazole Hives   Flagyl [Metronidazole Hcl] Rash   Latex Rash and Other (See Comments)    No latex use since age 34. Yeast infection with condom use.   Metoclopramide Other (See Comments), Nausea And Vomiting, Nausea Only, Palpitations and Photosensitivity    Other reaction(s): Confusion (intolerance), Cramps (ALLERGY/intolerance), Sweating (intolerance)   Tramadol Anxiety, Diarrhea, Nausea And Vomiting, Nausea Only, Other (See Comments), Palpitations, Photosensitivity, Swelling and Tinitus    Muscle spasms  Other reaction(s): Arthralgias (intolerance), Bladder Spasms (intolerance), Chills (intolerance), Confusion (intolerance), Cramps  (ALLERGY/intolerance), Delusions (intolerance), Diaphoresis / Sweating (intolerance), Dizziness (intolerance), Facial Edema (intolerance), Fever (intolerance), Flushing (ALLERGY/intolerance), Generalized Edema (intolerance), GI Upset (intolerance), Hypertension (intolerance), Lethargy (intolerance), Malaise (intolerance), Mental Status Changes (intolerance), Myalgias (intolerance), Psychosis (intolerance), Sweating (intolerance), Tremor (intolerance)  Other Reaction(s): Angioedema, Arthralgias, Diarrhea, Dizziness, Fatigue, Flushing, GI Intolerance, Hypertension, Hypotension, Mental Status Changes, Myalgias, Other (See Comments), Psychosis  Reaction: Bladder Spasms (intolerance) Reaction: Chills (intolerance) Reaction: Diaphoresis / Sweating (intolerance) Reaction: Fever (intolerance) Reaction: Sweating (intolerance) Reaction: Tremor (intolerance)   ROS neg/noncontributory except as noted HPI/below      Objective:     BP 128/81   Pulse 90   Temp 98.1 F (36.7 C) (Temporal)   Resp 16   Ht 5' (1.524 m)   Wt 143 lb 2 oz (64.9 kg)   LMP 01/29/2023 (Exact Date)   SpO2 95%   BMI 27.95 kg/m  Wt Readings from Last 3  Encounters:  02/02/23 143 lb 2 oz (64.9 kg)  01/11/23 141 lb 4 oz (64.1 kg)  02/25/22 123 lb 6 oz (56 kg)    Physical Exam   Gen: WDWN NAD HEENT: NCAT, conjunctiva not injected, sclera nonicteric NECK:  supple, no thyromegaly, no nodes, no carotid bruits CARDIAC: RRR, S1S2+, no murmur. DP 2+B LUNGS: CTAB. No wheezes ABDOMEN:  BS+, soft, NTND, No HSM, no masses EXT:  no edema MSK: no gross abnormalities.  NEURO: A&O x3.  CN II-XII intact.  PSYCH: normal mood. Good eye contact     Assessment & Plan:  Anemia, unspecified type -     CBC with Differential/Platelet -     Comprehensive metabolic panel -     IBC + Ferritin -     Vitamin B12 -     Folate  Migraine without aura and without status migrainosus, not intractable -     Nurtec; Take 1 tablet (75 mg total) by  mouth every other day.  Dispense: 16 tablet; Refill: 3 -     Sedimentation rate  Chronic daily headache -     Sedimentation rate  Other fatigue -     CBC with Differential/Platelet -     Comprehensive metabolic panel -     Hemoglobin A1c -     TSH   Assessment and Plan    Menorrhagia Heavy menstrual bleeding lasting 17-19 days with episodes of watery discharge has persisted for two years, causing hospital visits due to pain. Risks of heavy bleeding, including anemia and hospitalization, were discussed, along with the potential benefits of a hysterectomy. Preferring non-surgical options due to concerns about drug interactions with Celexa, alternative treatments such as combination birth control were considered. A referral to a gynecologist for further evaluation and management is planned, and combination birth control will be discussed to manage heavy bleeding.  Chronic Anemia Long-standing anemia with low RBC count and high MCV is noted. Sporadic intake of iron and B12, along with heavy menstrual periods, likely contribute to anemia. The importance of consistent supplementation and addressing heavy menstrual bleeding to manage anemia was discussed. Risks of untreated anemia include fatigue, weakness, and cardiovascular complications. A CBC will be ordered, and thyroid function and other relevant blood work will be evaluated.  Migraine Debilitating headaches associated with the menstrual cycle, sometimes accompanied by numbness and vertigo, are present. No specific migraine medication is currently used. The potential benefits of Nurtec as a preventative measure were discussed, with a need to monitor improvement in headache frequency and severity. Risks of untreated migraines include chronic pain and functional impairment. Nurtec will be prescribed as a preventative measure to be taken every other day, with monitoring for improvement in headache frequency and severity.  Medication Management  Issues There are difficulties managing medications, particularly Celexa, leading to significant side effects and functional impairment. Issues with pain management and denied medication by the pain management doctor were noted. The importance of clear communication with Dr. Logan Bores to clarify medication management and address discrepancies in the medical record was discussed. A referral to a psychiatrist for further evaluation and management of psychiatric medications is considered. Frustration with current medication management and its impact on quality of life was expressed. It is advised to contact Dr. Logan Bores to clarify medication management and address discrepancies in the medical record, with consideration for a referral to a psychiatrist.  General Health Maintenance Thyroid function tests and other relevant blood work have not been conducted in several years. The importance of  regular health maintenance to monitor and manage potential underlying conditions was discussed. Thyroid function tests and other relevant blood work will be ordered.  Follow-up A follow-up appointment will be scheduled to review blood work results and assess treatment efficacy. Regular visits will continue to address ongoing health concerns.       Return in about 4 weeks (around 03/02/2023) for chronic follow-up.  Angelena Sole, MD

## 2023-02-02 NOTE — Patient Instructions (Signed)
It was very nice to see you today!  Nurtec for headache every other day  Call gyn   PLEASE NOTE:  If you had any lab tests please let us know if you have not heard back within a few days. You may see your results on MyChart before we have a chance to review them but we will give you a call once they are reviewed by [place]. If we ordered any referrals today, please let us know if you have not heard from their office within the next week.   Please try these tips to maintain a healthy lifestyle:  Eat most of your calories during the day when you are active. Eliminate processed foods including packaged sweets (pies, cakes, cookies), reduce intake of potatoes, white bread, white pasta, and white rice. Look for whole grain options, oat flour or almond flour.  Each meal should contain half fruits/vegetables, one quarter protein, and one quarter carbs (no bigger than a computer mouse).  Cut down on sweet beverages. This includes juice, soda, and sweet tea. Also watch fruit intake, though this is a healthier sweet option, it still contains natural sugar! Limit to 3 servings daily.  Drink at least 1 glass of water with each meal and aim for at least 8 glasses per day  Exercise at least 150 minutes every week.

## 2023-02-03 LAB — COMPREHENSIVE METABOLIC PANEL
ALT: 9 U/L (ref 0–35)
AST: 14 U/L (ref 0–37)
Albumin: 4.4 g/dL (ref 3.5–5.2)
Alkaline Phosphatase: 45 U/L (ref 39–117)
BUN: 11 mg/dL (ref 6–23)
CO2: 29 meq/L (ref 19–32)
Calcium: 9.2 mg/dL (ref 8.4–10.5)
Chloride: 98 meq/L (ref 96–112)
Creatinine, Ser: 0.69 mg/dL (ref 0.40–1.20)
GFR: 105.35 mL/min (ref >60.00)
Glucose, Bld: 81 mg/dL (ref 70–99)
Potassium: 4.2 meq/L (ref 3.5–5.1)
Sodium: 136 meq/L (ref 135–145)
Total Bilirubin: 0.2 mg/dL (ref 0.2–1.2)
Total Protein: 8 g/dL (ref 6.0–8.3)

## 2023-02-03 LAB — CBC WITH DIFFERENTIAL/PLATELET
Basophils Absolute: 0.1 10*3/uL (ref 0.0–0.1)
Basophils Relative: 0.8 % (ref 0.0–3.0)
Eosinophils Absolute: 0.3 10*3/uL (ref 0.0–0.7)
Eosinophils Relative: 3 % (ref 0.0–5.0)
HCT: 42.5 % (ref 36.0–46.0)
Hemoglobin: 14.3 g/dL (ref 12.0–15.0)
Lymphocytes Relative: 26 % (ref 12.0–46.0)
Lymphs Abs: 2.3 10*3/uL (ref 0.7–4.0)
MCHC: 33.7 g/dL (ref 30.0–36.0)
MCV: 96.1 fL (ref 78.0–100.0)
Monocytes Absolute: 0.5 10*3/uL (ref 0.1–1.0)
Monocytes Relative: 5.8 % (ref 3.0–12.0)
Neutro Abs: 5.6 10*3/uL (ref 1.4–7.7)
Neutrophils Relative %: 64.4 % (ref 43.0–77.0)
Platelets: 244 10*3/uL (ref 150.0–400.0)
RBC: 4.42 Mil/uL (ref 3.87–5.11)
RDW: 12.5 % (ref 11.5–15.5)
WBC: 8.7 10*3/uL (ref 4.0–10.5)

## 2023-02-03 LAB — IBC + FERRITIN
Ferritin: 36.9 ng/mL (ref 10.0–291.0)
Iron: 79 ug/dL (ref 42–145)
Saturation Ratios: 17.7 % — ABNORMAL LOW (ref 20.0–50.0)
TIBC: 445.2 ug/dL (ref 250.0–450.0)
Transferrin: 318 mg/dL (ref 212.0–360.0)

## 2023-02-03 LAB — SEDIMENTATION RATE: Sed Rate: 39 mm/h — ABNORMAL HIGH (ref 0–20)

## 2023-02-03 LAB — VITAMIN B12: Vitamin B-12: 629 pg/mL (ref 211–911)

## 2023-02-03 LAB — FOLATE: Folate: 21.7 ng/mL (ref >5.9)

## 2023-02-03 LAB — TSH: TSH: 2.54 u[IU]/mL (ref 0.35–5.50)

## 2023-02-03 LAB — HEMOGLOBIN A1C: Hgb A1c MFr Bld: 5.3 % (ref 4.6–6.5)

## 2023-02-08 ENCOUNTER — Encounter: Payer: Self-pay | Admitting: Family Medicine

## 2023-02-08 NOTE — Progress Notes (Signed)
1.  Labs look great except 2.  Anemia-no anemia.  Iron levels on low side of normal.  Make sure the new supplement has some iron in it since losing a lot in periods, 3.  Sed rate a little high-inflammation in body-could be the IC, arthritis, headaches, anything.  Will discuss more at next visit-if she can take ibuprofen or aleve, do that

## 2023-02-10 ENCOUNTER — Other Ambulatory Visit (HOSPITAL_COMMUNITY): Payer: Self-pay

## 2023-02-10 ENCOUNTER — Telehealth: Payer: Self-pay

## 2023-02-10 NOTE — Telephone Encounter (Signed)
Pharmacy Patient Advocate Encounter   Received notification from  [facility] Portal that prior authorization for Nurtec 75MG  dispersible tablets is required/requested.   Insurance verification completed.   The patient is insured through [facility] [employer] .   Per test claim: PA required; PA submitted to above mentioned insurance via CoverMyMeds Key/confirmation #/EOC  [id number] Status is pending

## 2023-02-11 ENCOUNTER — Encounter: Payer: Self-pay | Admitting: *Deleted

## 2023-02-11 NOTE — Telephone Encounter (Signed)
Patient notified of message below.

## 2023-02-11 NOTE — Telephone Encounter (Signed)
Pharmacy Patient Advocate Encounter  Received notification from [facility] that Prior Authorization for Nurtec 75MG  dispersible tablets has been DENIED.  Full denial letter will be uploaded to the media tab. See denial reason below.   PA #/Case ID/Reference #: [id number]

## 2023-03-02 ENCOUNTER — Ambulatory Visit: Payer: Medicaid Other | Admitting: Family Medicine

## 2023-03-02 ENCOUNTER — Encounter: Payer: Self-pay | Admitting: Family Medicine

## 2023-03-02 VITALS — BP 109/74 | HR 80 | Temp 98.4°F | Resp 18 | Ht 60.0 in | Wt 144.1 lb

## 2023-03-02 DIAGNOSIS — Z20828 Contact with and (suspected) exposure to other viral communicable diseases: Secondary | ICD-10-CM

## 2023-03-02 DIAGNOSIS — G43009 Migraine without aura, not intractable, without status migrainosus: Secondary | ICD-10-CM

## 2023-03-02 DIAGNOSIS — N921 Excessive and frequent menstruation with irregular cycle: Secondary | ICD-10-CM

## 2023-03-02 DIAGNOSIS — N3010 Interstitial cystitis (chronic) without hematuria: Secondary | ICD-10-CM

## 2023-03-02 DIAGNOSIS — R102 Pelvic and perineal pain: Secondary | ICD-10-CM | POA: Diagnosis not present

## 2023-03-02 MED ORDER — UBRELVY 50 MG PO TABS: 50.0000 mg | ORAL_TABLET | Freq: Every day | ORAL | 3 refills | Status: DC | PRN

## 2023-03-02 MED ORDER — OSELTAMIVIR PHOSPHATE 75 MG PO CAPS: 75.0000 mg | ORAL_CAPSULE | Freq: Every day | ORAL | 0 refills | Status: DC

## 2023-03-02 NOTE — Patient Instructions (Signed)
 It was very nice to see you today!  Tamiflu daily for 10 days for prevention.  If symptoms, then twice/day.   PLEASE NOTE:  If you had any lab tests please let us know if you have not heard back within a few days. You may see your results on MyChart before we have a chance to review them but we will give you a call once they are reviewed by [place]. If we ordered any referrals today, please let us know if you have not heard from their office within the next week.   Please try these tips to maintain a healthy lifestyle:  Eat most of your calories during the day when you are active. Eliminate processed foods including packaged sweets (pies, cakes, cookies), reduce intake of potatoes, white bread, white pasta, and white rice. Look for whole grain options, oat flour or almond flour.  Each meal should contain half fruits/vegetables, one quarter protein, and one quarter carbs (no bigger than a computer mouse).  Cut down on sweet beverages. This includes juice, soda, and sweet tea. Also watch fruit intake, though this is a healthier sweet option, it still contains natural sugar! Limit to 3 servings daily.  Drink at least 1 glass of water with each meal and aim for at least 8 glasses per day  Exercise at least 150 minutes every week.

## 2023-03-02 NOTE — Progress Notes (Signed)
 Subjective:     Patient ID: Melissa Sellers, female    DOB: 07/16/1980, 43 y.o.   MRN: [id number]  Chief Complaint  Patient presents with   Medical Management of Chronic Issues    4 week follow-up  Bleeding heavy x 6 days with clots and muddy consistency     HPI  Has upcoming appt w/psych Dr. Evelene Croon  Discussed the use of AI scribe software for clinical note transcription with the patient, who gave verbal consent to proceed.  History of Present Illness   Melissa Sellers is a 43 year old female who presents with concerns about medication supply and withdrawal symptoms.  She has significant concerns regarding her medication management, particularly with her current prescription of vicodin. She takes four tablets daily, which has helped alleviate symptoms such as heart palpitations, cognitive issues, and vision problems. However, she is worried about running out of her medication as she was only given a 15-day supply and has been unable to secure a follow-up prescription. She fears severe withdrawal symptoms, which she describes as 'worse than any type of pain you can think of in your body.'  she is finally back to normal and fears becoming incapacitated again if Dr. Logan Bores doesn't continue the 4/day of vicodin.  "cannot go thru that again".  may injure self  She has a history of being prescribed Celexa, which she believes has caused her to become allergic to many medications. She describes severe withdrawal symptoms when her medication regimen is altered, including loss of speech, mobility issues, and muscle dysfunction. She is deeply concerned about the impact of these symptoms on her ability to care for her daughter, who is currently ill with influenza A, and her mother, who is also unwell.  She reports heavy menstrual periods, requiring frequent tampon changes, and describes the blood as 'muddy' with clots. She has been taking esterase but has halved the dosage recently, which she suspects  may have triggered her current menstrual cycle. She also takes iron and vitamin D supplements daily and discontinued B12 due to dizziness and diarrhea.  She experiences migraines approximately once a month, lasting about three days, but notes that they have been less frequent recently. She attributes this improvement to stabilized serotonin levels and has not had a migraine in over a month.  Her daughter, Edgardo Roys, has been experiencing heart issues, including episodes of passing out and tachycardia lasting up to two hours. Although a cardiologist has evaluated her, Edgardo Roys refused to wear a heart monitor for further assessment.       Health Maintenance Due  Topic Date Due   HIV Screening  Never done   Hepatitis C Screening  Never done   Cervical Cancer Screening (HPV/Pap Cotest)  07/22/2015    Past Medical History:  Diagnosis Date   Allergy    Anxiety    Arthritis    Bladder spasm    Depression    Endometriosis    Frequency of urination    Heart murmur    Herpes simplex    Hypertension    Interstitial cystitis    Migraines    Nocturia    Pelvic pain in female    Seizures (HCC)    Urgency of urination     Past Surgical History:  Procedure Laterality Date   AUGMENTATION MAMMAPLASTY  2009   IMPLANTS   CYSTO/ HOD/ INSTILLATION THERAPY  AUG 2011   ECTOPIC PREGNANCY SURGERY  2010   right salpingectomy   MULTIPLE LAPAROSCOPIES FOR ENDOMETRIOSIS  LAST ONE 2010   OOPHORECTOMY  2013   LSO   PELVIC LAPAROSCOPY     DL  LSO     Current Outpatient Medications:    ALPRAZolam (XANAX) 0.5 MG tablet, Take 1 tablet (0.5 mg total) by mouth at bedtime as needed for anxiety., Disp: 7 tablet, Rfl: 0   citalopram (CELEXA) 20 MG tablet, Take 1 tablet by mouth daily., Disp: , Rfl:    estradiol (ESTRACE) 1 MG tablet, Take 1 mg by mouth daily., Disp: , Rfl:    HYDROcodone-acetaminophen (NORCO) 10-325 MG tablet, Take by mouth., Disp: , Rfl:    oseltamivir (TAMIFLU) 75 MG capsule, Take 1  capsule (75 mg total) by mouth daily., Disp: 10 capsule, Rfl: 0   Ubrogepant (UBRELVY) 50 MG TABS, Take 1 tablet (50 mg total) by mouth daily as needed., Disp: 8 tablet, Rfl: 3  Allergies  Allergen Reactions   Diazepam Swelling    Tongue/throat   Amitriptyline Other (See Comments)    MUSCLE SPASMS   Ciprofloxacin Hives    Other reaction(s): Other (See Comments) Flu like sx's    Codeine Other (See Comments)    Itching, hives,    Doxycycline Other (See Comments)    "flu-like symptoms."     Fluconazole     Interacts with Celexa Other reaction(s): Other (See Comments) Interacts with Celexa   Penicillins Hives    Did it involve swelling of the face/tongue/throat, SOB, or low BP? Unknown Did it involve sudden or severe rash/hives, skin peeling, or any reaction on the inside of your mouth or nose? No Did you need to seek medical attention at a hospital or doctor's office? Y When did it last happen? infant      If all above answers are "NO", may proceed with cephalosporin use.     Shellfish Allergy Hives and Swelling   Sulfa Antibiotics Other (See Comments)    SEIZURES   Sulfamethoxazole Hives   Flagyl [Metronidazole Hcl] Rash   Latex Rash and Other (See Comments)    No latex use since age 73. Yeast infection with condom use.   Metoclopramide Other (See Comments), Nausea And Vomiting, Nausea Only, Palpitations and Photosensitivity    Other reaction(s): Confusion (intolerance), Cramps (ALLERGY/intolerance), Sweating (intolerance)   Tramadol Anxiety, Diarrhea, Nausea And Vomiting, Nausea Only, Other (See Comments), Palpitations, Photosensitivity, Swelling and Tinitus    Muscle spasms  Other reaction(s): Arthralgias (intolerance), Bladder Spasms (intolerance), Chills (intolerance), Confusion (intolerance), Cramps (ALLERGY/intolerance), Delusions (intolerance), Diaphoresis / Sweating (intolerance), Dizziness (intolerance), Facial Edema (intolerance), Fever (intolerance), Flushing  (ALLERGY/intolerance), Generalized Edema (intolerance), GI Upset (intolerance), Hypertension (intolerance), Lethargy (intolerance), Malaise (intolerance), Mental Status Changes (intolerance), Myalgias (intolerance), Psychosis (intolerance), Sweating (intolerance), Tremor (intolerance)  Other Reaction(s): Angioedema, Arthralgias, Diarrhea, Dizziness, Fatigue, Flushing, GI Intolerance, Hypertension, Hypotension, Mental Status Changes, Myalgias, Other (See Comments), Psychosis  Reaction: Bladder Spasms (intolerance) Reaction: Chills (intolerance) Reaction: Diaphoresis / Sweating (intolerance) Reaction: Fever (intolerance) Reaction: Sweating (intolerance) Reaction: Tremor (intolerance)   ROS neg/noncontributory except as noted HPI/below      Objective:     BP 109/74   Pulse 80   Temp 98.4 F (36.9 C) (Temporal)   Resp 18   Ht 5' (1.524 m)   Wt 144 lb 2 oz (65.4 kg)   LMP 02/25/2023 (Exact Date)   SpO2 96%   BMI 28.15 kg/m  Wt Readings from Last 3 Encounters:  03/02/23 144 lb 2 oz (65.4 kg)  02/02/23 143 lb 2 oz (64.9 kg)  01/11/23 141 lb 4 oz (  64.1 kg)    Physical Exam   Gen: WDWN NAD HEENT: NCAT, conjunctiva not injected, sclera nonicteric NEURO: A&O x3.  CN II-XII intact.  PSYCH: normal mood. Good eye contact     Assessment & Plan:  Exposure to influenza  Menorrhagia with irregular cycle -     Ambulatory referral to Obstetrics / Gynecology  Migraine without aura and without status migrainosus, not intractable -     Bernita Raisin; Take 1 tablet (50 mg total) by mouth daily as needed.  Dispense: 8 tablet; Refill: 3  IC (interstitial cystitis)  Pelvic pain  Other orders -     Oseltamivir Phosphate; Take 1 capsule (75 mg total) by mouth daily.  Dispense: 10 capsule; Refill: 0  Assessment and Plan    Medication Management for Chronic Pain Experiencing significant distress due to medication management issues for chronic pain. Currently taking vicodin, four tablets daily,  which has stabilized symptoms such as heart palpitations, cognitive function, and vision. There is concern about running out of medication and potential withdrawal symptoms. Unable to get a timely response from Dr. Logan Bores, the prescribing physician, and worried about the upcoming storm affecting medication access. Discussed withdrawal risks, including severe physical and cognitive symptoms. Advised to urgently contact Dr. Logan Bores for a refill and consider visiting [facility] in person if phone contact fails. An ER visit is a last resort if medication is not obtained.  Influenza A Exposure Daughter diagnosed with Influenza A, raising concerns about contracting the flu, especially with the current health situation and upcoming storm. Discussed Tamiflu as a preventative measure, noting potential side effects that may mimic flu symptoms. Prescribe Tamiflu for prevention, once daily for ten days, and increase to twice daily if flu symptoms develop.  Heavy Menstrual Bleeding Reports heavy menstrual bleeding, requiring hourly tampon changes and passing clots. Recently halved esterase dosage. Discussed potential triggers and the need for gynecological evaluation. Refer to gynecology for further evaluation and management.  Iron Deficiency Taking daily iron supplements due to low iron saturation levels, likely secondary to heavy menstrual bleeding. Red cell indices have improved, but iron saturation remains low. Continue daily iron supplementation.  Vitamin D Deficiency Currently taking vitamin D supplements. Continue daily vitamin D supplementation.  Migraine Experiences migraines approximately once a month, lasting about three days, but has not had a migraine in over a month. Previous attempts to prescribe Nurtec were unsuccessful. Discussed Bernita Raisin as an alternative for as-needed migraine treatment. Prescribe Ubrelvy as needed for migraine treatment.  General Health Maintenance Managing health with  iron and vitamin D supplements. Stopped taking B12 due to adverse effects. Continue current supplementation regimen.  Follow-up Schedule a follow-up appointment in three months or sooner if symptoms worsen. Ensure referral to gynecology is completed.        Return in about 3 months (around 05/30/2023) for chronic follow-up.  Angelena Sole, MD

## 2023-03-09 ENCOUNTER — Other Ambulatory Visit (HOSPITAL_COMMUNITY): Payer: Self-pay

## 2023-03-09 ENCOUNTER — Telehealth: Payer: Self-pay

## 2023-03-09 NOTE — Telephone Encounter (Signed)
 Pharmacy Patient Advocate Encounter  Received notification from [facility] that Prior Authorization for Ubrelvy 50MG  tablets has been DENIED.  See denial reason below. No denial letter attached in CMM. Will attach denial letter to Media tab once received.   PA #/Case ID/Reference #: [id number]

## 2023-03-09 NOTE — Telephone Encounter (Signed)
 Pharmacy Patient Advocate Encounter   Received notification from  [employer] that prior authorization for Ubrelvy 50MG  tablets is required/requested.   Insurance verification completed.   The patient is insured through [facility] .   Per test claim: PA required; PA submitted to above mentioned insurance via CoverMyMeds Key/confirmation #/EOC [facility] Status is pending

## 2023-03-12 ENCOUNTER — Encounter: Payer: Self-pay | Admitting: *Deleted

## 2023-03-12 NOTE — Telephone Encounter (Signed)
 Patient notified of message below.

## 2023-04-01 ENCOUNTER — Other Ambulatory Visit: Payer: Self-pay | Admitting: *Deleted

## 2023-04-01 ENCOUNTER — Encounter: Payer: Self-pay | Admitting: Family Medicine

## 2023-04-01 DIAGNOSIS — M419 Scoliosis, unspecified: Secondary | ICD-10-CM

## 2023-05-03 ENCOUNTER — Telehealth: Payer: Self-pay | Admitting: *Deleted

## 2023-05-03 NOTE — Telephone Encounter (Unsigned)
 Copied from CRM [id number]. Topic: Clinical - Medication Question >> Apr 30, 2023  3:35 PM Melissa Sellers wrote: Reason for CRM: Patient called in regarding prescription hydrocodone , stated the provider that prescribes them to her will be out on leave for a while and will not be able to prescribe, was wondering if they will be anyway her pcp could bridge the time since he is out and doesn't know when he will be back ,  Wanted to know if there is nay way he could   [phone]

## 2023-05-03 NOTE — Telephone Encounter (Signed)
 Copied from CRM [id number]. Topic: Clinical - Medication Question >> May 03, 2023  1:48 PM Albertha Alosa wrote: Reason for CRM: Patient called in regarding medication hydrocodone , I did informed her per note that Dr.Kulik stated that they should be someone covering him. Patient stated that she has been speaking with Dr.Evans that is our of medical leave and may never return back to work, patient stated she has went to numerous doctors to get her prescribes with the medication, stated Dr.Evans has stated that patient can get PCP send in medication . Since he is currently on medical leave  She would like for Dr.Kulik to personally call her back .  [phone]

## 2023-05-04 ENCOUNTER — Encounter: Payer: Self-pay | Admitting: Family Medicine

## 2023-05-04 ENCOUNTER — Ambulatory Visit: Payer: Self-pay

## 2023-05-04 NOTE — Telephone Encounter (Signed)
 FYI

## 2023-05-04 NOTE — Telephone Encounter (Signed)
  Chief Complaint: potential withdrawal Symptoms: nausea, shaking, chills, cramping in extremities, hallucinations Frequency: started yesterday Disposition: [x] ED /[] Urgent Care (no appt availability in office) / [] Appointment(In office/virtual)/ []  [facility]/ [] Home Care/ [] Refused Recommended Disposition /[] [facility]/ []  Follow-up with PCP Additional Notes: Pt states that she is nauseous. Pt states that her s/s are the same as covid, but could be withdrawal from celexa  and hydrocodone . Pt states that she has been tapering herself until she gets a pain doctor. Pt states that she has been trying to get into a pain doctor. Pt states that she believes she is dehydrated and has been using Pedialyte. Pt states that she has been having shaking/chills and sweating.  Pt states that  Pt denies SI/HI. Pt still taking celexa  every day at 8pm. Pt last dose of hydrocodone  at 0800, half dose - 5mg  total, pt states she needs this medication/hydrocodone  refilled. Pt states that she has to stay with the same brand as it doesn't negatively impact her. Pt states she needs her PCP to write the hydrocodone  until the pain appt in June. Pt states that her urologist has been writing for the medication at this time, but he is out of the office currently. Pt states that she had difficulty breathing yesterday, pt states that she had also has been experiencing hallucinations. Pt advised ED. Pt also advised to call her urologist office.  Reason for Disposition . Feeling very shaky (i.e., visible tremors of hands)  Answer Assessment - Initial Assessment Questions 1. DRUG: "What drug(s) are you using?"      Hydrocodone  and celexa  2. ROUTE: "How are you using this drug?" (e.g., swallow, smoke, inject, huff, snort)     swallow 3. HOW OFTEN: "How many days per week do you typically use it?"     Started tapering last week 5. LAST 24 HOURS: "Have you used it in the last 24 hours?"     Last dose was 0800  today 6. SYMPTOMS: "What symptoms are you currently experiencing?" (e.g., none, headache, chest pain, abdomen pain, vomiting)     Shaking, chills, sweating, nausea 9. SUPPORT: "Who is with you now?" "Who do you live with?" "Do you have family or friends who you can talk to?"      Daughter, step dad  Protocols used: Opioid Use and Problems-A-AH

## 2023-05-04 NOTE — Telephone Encounter (Signed)
 Called pt and checked on patient; pt is scheduled with PCP for 05/05/23 and advised appt time. Pt verbalized understanding

## 2023-05-04 NOTE — Telephone Encounter (Signed)
 Pt scheduled with PCP for 05/05/23

## 2023-05-05 ENCOUNTER — Encounter: Payer: Self-pay | Admitting: Family Medicine

## 2023-05-05 ENCOUNTER — Ambulatory Visit: Admitting: Family Medicine

## 2023-05-05 VITALS — BP 130/83 | HR 103 | Temp 98.6°F | Resp 16 | Ht 60.0 in | Wt 144.0 lb

## 2023-05-05 DIAGNOSIS — Z79899 Other long term (current) drug therapy: Secondary | ICD-10-CM

## 2023-05-05 DIAGNOSIS — R102 Pelvic and perineal pain: Secondary | ICD-10-CM

## 2023-05-05 MED ORDER — HYDROCODONE-ACETAMINOPHEN 10-325 MG PO TABS: 1.0000 | ORAL_TABLET | Freq: Four times a day (QID) | ORAL | 0 refills | Status: DC | PRN

## 2023-05-05 NOTE — Patient Instructions (Signed)

## 2023-05-05 NOTE — Progress Notes (Signed)
 Subjective:     Patient ID: Melissa Sellers, female    DOB: Mar 25, 1980, 43 y.o.   MRN: [id number]  Chief Complaint  Patient presents with   Medication Refill    Need refills of pain medication    HPI Discussed the use of AI scribe software for clinical note transcription with the patient, who gave verbal consent to proceed.  History of Present Illness Melissa Sellers is a 43 year old female who presents with medication management issues related to chronic pain.  She has been experiencing difficulties in managing her chronic pain due to issues with her current pain management provider, who has been unavailable due to personal health issues. Communication has been inconsistent since December, leading to challenges in receiving her medication on time.  She has previously visited a pain management doctor and a spine specialist. The pain management doctor was unsatisfactory, and the spine specialist only offered preventative measures and injections. She had a reaction to an injection in her back, which also incurred significant costs. She is attempting to switch to a new pain management provider, but insurance constraints have delayed this until June.  She is currently taking hydrocodone  10/325 mg, half a tablet every three hours, totaling four tablets a day. She has been attempting to taper her dosage to manage her supply, as her prescription was due on April 22. She experiences withdrawal symptoms such as diarrhea, vomiting, inability to eat, and fluctuations in body temperature when her dosage is reduced. She is asking for a bridge until she sees pain mgmt at [facility]  She also reports symptoms of vertigo, heart palpitations, and shaking, which she attributes to her medication issues.  She has been prescribed Xanax  to be taken once a day. Managed by psych Dr. Deborra Falter.  She has no current thoughts of suicide but acknowledges that these thoughts have occurred in the past during medication crises.  She is scheduled to see a new pain management doctor at [facility] on June 2.    Health Maintenance Due  Topic Date Due   HIV Screening  Never done   Hepatitis C Screening  Never done   Cervical Cancer Screening (HPV/Pap Cotest)  07/22/2015    Past Medical History:  Diagnosis Date   Allergy    Anxiety    Arthritis    Bladder spasm    Depression    Endometriosis    Frequency of urination    Heart murmur    Herpes simplex    Hypertension    Interstitial cystitis    Migraines    Nocturia    Pelvic pain in female    Seizures (HCC)    Urgency of urination     Past Surgical History:  Procedure Laterality Date   AUGMENTATION MAMMAPLASTY  2009   IMPLANTS   CYSTO/ HOD/ INSTILLATION THERAPY  AUG 2011   ECTOPIC PREGNANCY SURGERY  2010   right salpingectomy   MULTIPLE LAPAROSCOPIES FOR ENDOMETRIOSIS  LAST ONE 2010   OOPHORECTOMY  2013   LSO   PELVIC LAPAROSCOPY     DL  LSO     Current Outpatient Medications:    acyclovir cream (ZOVIRAX) 5 %, Apply topically., Disp: , Rfl:    ALPRAZolam  (XANAX ) 0.5 MG tablet, Take 1 tablet (0.5 mg total) by mouth at bedtime as needed for anxiety., Disp: 7 tablet, Rfl: 0   citalopram  (CELEXA ) 20 MG tablet, Take 1 tablet by mouth daily., Disp: , Rfl:    estradiol  (ESTRACE ) 1 MG tablet, Take  1 mg by mouth daily., Disp: , Rfl:    HYDROcodone -acetaminophen  (NORCO) 10-325 MG tablet, Take 1 tablet by mouth every 6 (six) hours as needed., Disp: 120 tablet, Rfl: 0  Allergies  Allergen Reactions   Diazepam  Swelling    Tongue/throat   Amitriptyline Other (See Comments)    MUSCLE SPASMS   Ciprofloxacin Hives    Other reaction(s): Other (See Comments) Flu like sx's    Codeine Other (See Comments)    Itching, hives,    Doxycycline Other (See Comments)    "flu-like symptoms."     Fluconazole      Interacts with Celexa  Other reaction(s): Other (See Comments) Interacts with Celexa    Penicillins Hives    Did it involve swelling of the  face/tongue/throat, SOB, or low BP? Unknown Did it involve sudden or severe rash/hives, skin peeling, or any reaction on the inside of your mouth or nose? No Did you need to seek medical attention at a hospital or doctor's office? Y When did it last happen? infant      If all above answers are "NO", may proceed with cephalosporin use.     Shellfish Allergy Hives and Swelling   Sulfa Antibiotics Other (See Comments)    SEIZURES   Sulfamethoxazole Hives   Flagyl  [Metronidazole  Hcl] Rash   Latex Rash and Other (See Comments)    No latex use since age 38. Yeast infection with condom use.   Metoclopramide  Other (See Comments), Nausea And Vomiting, Nausea Only, Palpitations and Photosensitivity    Other reaction(s): Confusion (intolerance), Cramps (ALLERGY/intolerance), Sweating (intolerance)   Tramadol  Anxiety, Diarrhea, Nausea And Vomiting, Nausea Only, Other (See Comments), Palpitations, Photosensitivity, Swelling and Tinitus    Muscle spasms  Other reaction(s): Arthralgias (intolerance), Bladder Spasms (intolerance), Chills (intolerance), Confusion (intolerance), Cramps (ALLERGY/intolerance), Delusions (intolerance), Diaphoresis / Sweating (intolerance), Dizziness (intolerance), Facial Edema (intolerance), Fever (intolerance), Flushing (ALLERGY/intolerance), Generalized Edema (intolerance), GI Upset (intolerance), Hypertension (intolerance), Lethargy (intolerance), Malaise (intolerance), Mental Status Changes (intolerance), Myalgias (intolerance), Psychosis (intolerance), Sweating (intolerance), Tremor (intolerance)  Other Reaction(s): Angioedema, Arthralgias, Diarrhea, Dizziness, Fatigue, Flushing, GI Intolerance, Hypertension, Hypotension, Mental Status Changes, Myalgias, Other (See Comments), Psychosis  Reaction: Bladder Spasms (intolerance) Reaction: Chills (intolerance) Reaction: Diaphoresis / Sweating (intolerance) Reaction: Fever (intolerance) Reaction: Sweating (intolerance) Reaction:  Tremor (intolerance)   ROS neg/noncontributory except as noted HPI/below      Objective:     BP 130/83   Pulse (!) 103   Temp 98.6 F (37 C) (Temporal)   Resp 16   Ht 5' (1.524 m)   Wt 144 lb (65.3 kg)   SpO2 95%   BMI 28.12 kg/m  Wt Readings from Last 3 Encounters:  05/05/23 144 lb (65.3 kg)  03/02/23 144 lb 2 oz (65.4 kg)  02/02/23 143 lb 2 oz (64.9 kg)    Physical Exam   Gen: WDWN NAD HEENT: NCAT, conjunctiva not injected, sclera nonicteric CARDIAC: tachy RRR, S1S2+,  EXT:  no edema MSK: no gross abnormalities.  NEURO: A&O x3.  CN II-XII intact.  PSYCH: normal mood. Good eye contact  Pdmp checked- reviewed Dr. Luster Salters notes/messages and called his office yesterday.      Assessment & Plan:  Pelvic pain  High risk medication use -     DRUG MONITORING, PANEL 8 WITH CONFIRMATION, URINE  Other orders -     HYDROcodone -Acetaminophen ; Take 1 tablet by mouth every 6 (six) hours as needed.  Dispense: 120 tablet; Refill: 0  Assessment and Plan Assessment & Plan Opioid dependence   She  has chronic opioid dependence, currently using hydrocodone  10/325 mg, taking half a tablet every three hours, totaling four tablets daily. She is tapering the dose due to a low supply, experiencing withdrawal symptoms such as diarrhea, vomiting, and shaking. She awaits an appointment with a new pain management doctor on June 2nd. There are no current suicidal thoughts, though she acknowledges past occurrences during withdrawal. She is frustrated with her current care and seeks a physician who listens and supports her transition to appropriate pain management. Prescribe 120 tablets of hydrocodone  10/325 mg for a month's supply. Perform a urine drug screen. Instruct her to request refills several days in advance to avoid running out of medication. Plan for follow-up with the pain management specialist on June 2nd.  Diarrhea and vomiting   Diarrhea and vomiting are likely due to opioid  withdrawal from hydrocodone  tapering, causing an inability to eat and temperature fluctuations. Manage opioid dependence to alleviate these symptoms and provide supportive care as needed.     Return if symptoms worsen or fail to improve.  Ellsworth Haas, MD

## 2023-05-06 ENCOUNTER — Ambulatory Visit: Payer: Self-pay
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
## 2023-05-09 ENCOUNTER — Encounter: Payer: Self-pay | Admitting: Family Medicine

## 2023-05-09 LAB — DRUG MONITORING, PANEL 8 WITH CONFIRMATION, URINE
6 Acetylmorphine: NEGATIVE ng/mL (ref <10)
Alcohol Metabolites: NEGATIVE ng/mL (ref <500)
Alphahydroxyalprazolam: 46 ng/mL — ABNORMAL HIGH (ref <25)
Alphahydroxymidazolam: NEGATIVE ng/mL (ref <50)
Alphahydroxytriazolam: NEGATIVE ng/mL (ref <50)
Aminoclonazepam: NEGATIVE ng/mL (ref <25)
Amphetamines: NEGATIVE ng/mL (ref <500)
Benzodiazepines: POSITIVE ng/mL — AB (ref <100)
Buprenorphine, Urine: NEGATIVE ng/mL (ref <5)
Cocaine Metabolite: NEGATIVE ng/mL (ref <150)
Codeine: NEGATIVE ng/mL (ref <50)
Creatinine: 27.9 mg/dL (ref >=20.0)
Hydrocodone: 1009 ng/mL — ABNORMAL HIGH (ref <50)
Hydromorphone: 180 ng/mL — ABNORMAL HIGH (ref <50)
Hydroxyethylflurazepam: NEGATIVE ng/mL (ref <50)
Lorazepam: NEGATIVE ng/mL (ref <50)
MDMA: NEGATIVE ng/mL (ref <500)
Marijuana Metabolite: NEGATIVE ng/mL (ref <20)
Morphine: NEGATIVE ng/mL (ref <50)
Nordiazepam: NEGATIVE ng/mL (ref <50)
Norhydrocodone: 1529 ng/mL — ABNORMAL HIGH (ref <50)
Opiates: POSITIVE ng/mL — AB (ref <100)
Oxazepam: NEGATIVE ng/mL (ref <50)
Oxidant: NEGATIVE ug/mL (ref <200)
Oxycodone: NEGATIVE ng/mL (ref <100)
Temazepam: NEGATIVE ng/mL (ref <50)
pH: 6.3 (ref 4.5–9.0)

## 2023-05-09 LAB — DM TEMPLATE

## 2023-05-09 NOTE — Progress Notes (Signed)
 AS EXPECTED

## 2023-05-13 ENCOUNTER — Encounter: Payer: Self-pay | Admitting: Family Medicine

## 2023-05-13 ENCOUNTER — Ambulatory Visit: Admitting: Family Medicine

## 2023-05-13 VITALS — BP 114/68 | HR 84 | Temp 98.1°F | Resp 16 | Ht 60.0 in | Wt 144.4 lb

## 2023-05-13 DIAGNOSIS — Z609 Problem related to social environment, unspecified: Secondary | ICD-10-CM

## 2023-05-13 DIAGNOSIS — F411 Generalized anxiety disorder: Secondary | ICD-10-CM

## 2023-05-13 DIAGNOSIS — R030 Elevated blood-pressure reading, without diagnosis of hypertension: Secondary | ICD-10-CM
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
## 2023-05-13 NOTE — Patient Instructions (Signed)
 Referral to resources

## 2023-05-14 ENCOUNTER — Telehealth: Payer: Self-pay | Admitting: *Deleted

## 2023-05-14 NOTE — Progress Notes (Signed)
 Complex Care Management Note Care Guide Note  05/14/2023 Name: Melissa Sellers MRN: [id number] DOB: [birth year]   Complex Care Management Outreach Attempts: An unsuccessful telephone outreach was attempted today to offer the patient information about available complex care management services.  Follow Up Plan:  Additional outreach attempts will be made to offer the patient complex care management information and services.   Encounter Outcome:  No Answer  Kandis Ormond, CMA [place]  [facility], [facility] Guide Direct Dial: [phone]  Fax: [phone] Website: [email or web address]

## 2023-05-17 NOTE — Progress Notes (Signed)
 Complex Care Management Note  Care Guide Note 05/17/2023 Name: Melissa Sellers MRN: [id number] DOB: 07-Aug-1980  [name] is a 42 y.o. year old female who sees Christel Cousins, MD for primary care. I reached out to Cynethia Heinecke by phone today to offer complex care management services.  [name] was given information about Complex Care Management services today including:   The Complex Care Management services include support from the care team which includes your Nurse Care Manager, Clinical Social Worker, or Pharmacist.  The Complex Care Management team is here to help remove barriers to the health concerns and goals most important to you. Complex Care Management services are voluntary, and the patient may decline or stop services at any time by request to their care team member.   Complex Care Management Consent Status: Patient agreed to services and verbal consent obtained.   Follow up plan:  Telephone appointment with complex care management team member scheduled for:  05/24/2023  Encounter Outcome:  Patient Scheduled  Kandis Ormond, CMA [place]  [facility], [facility] Guide Direct Dial: [phone]  Fax: [phone] Website: [email or web address]

## 2023-05-24 ENCOUNTER — Telehealth: Payer: Self-pay | Admitting: Licensed Clinical Social Worker

## 2023-06-01 ENCOUNTER — Ambulatory Visit: Payer: Medicaid Other | Admitting: Family Medicine

## 2023-06-01 ENCOUNTER — Encounter: Payer: Self-pay | Admitting: Family Medicine

## 2023-06-01 VITALS — BP 117/68 | HR 88 | Temp 98.4°F | Resp 16 | Ht 60.0 in | Wt 145.1 lb

## 2023-06-01 DIAGNOSIS — F411 Generalized anxiety disorder: Secondary | ICD-10-CM | POA: Diagnosis not present

## 2023-06-01 DIAGNOSIS — R102 Pelvic and perineal pain unspecified side: Secondary | ICD-10-CM

## 2023-06-01 DIAGNOSIS — N3010 Interstitial cystitis (chronic) without hematuria: Secondary | ICD-10-CM | POA: Diagnosis not present

## 2023-06-01 DIAGNOSIS — Z1211 Encounter for screening for malignant neoplasm of colon: Secondary | ICD-10-CM | POA: Diagnosis not present

## 2023-06-01 DIAGNOSIS — Z1212 Encounter for screening for malignant neoplasm of rectum: Secondary | ICD-10-CM

## 2023-06-01 DIAGNOSIS — J069 Acute upper respiratory infection, unspecified: Secondary | ICD-10-CM

## 2023-06-01 MED ORDER — HYDROCODONE-ACETAMINOPHEN 10-325 MG PO TABS: 1.0000 | ORAL_TABLET | Freq: Four times a day (QID) | ORAL | 0 refills | Status: DC | PRN

## 2023-06-01 NOTE — Progress Notes (Signed)
 Subjective:     Patient ID: Melissa Sellers, female    DOB: 1980-08-10, 43 y.o.   MRN: [id number]  Chief Complaint  Patient presents with   Medical Management of Chronic Issues    3 month follow-up Possible cold, chest congestion, sneezing started about 4 days ago, mom has pneumonia     HPI Discussed the use of AI scribe software for clinical note transcription with the patient, who gave verbal consent to proceed.  History of Present Illness Melissa Sellers is a 43 year old female who presents for medication refill and evaluation of respiratory symptoms.  She is seeking a refill for her hydrocodone , which she takes four times a day. She last picked up her prescription on the 22nd or 23rd of the previous month. She has started a new job with [employer], which has been physically demanding due to her ongoing pain issues. Despite the pain, she is motivated to work to improve her financial situation.  She describes recent respiratory symptoms that began about a week ago, including a tingling sensation in her chest that progressed to a raw feeling and a sensation of liquid filling her lungs. No shortness of breath, significant cough, or chills, and only a possible low-grade fever. Congestion is worse at night when lying down. Her mother recently had pneumonia, and she was instrumental in ensuring she received medical attention.  She has a history of smoking, which she resumed for stress reasons after moving in with her mother. However, she quit smoking two weeks ago due to adverse interactions with her medication, Celexa , which caused lightheadedness. She feels better since quitting, with improved mental clarity and no more morning heart palpitations.  She is currently managing multiple stressors, including her mother's health issues and her daughter's end-of-school-year activities. She is also dealing with car troubles, which impact her ability to work and earn money.  She has not had a  menstrual period recently and is interested in having a hormone panel done. She has reduced her estrogen intake. She has not yet established care with a gynecologist but plans to do so.    Health Maintenance Due  Topic Date Due   HIV Screening  Never done   Hepatitis C Screening  Never done   Cervical Cancer Screening (HPV/Pap Cotest)  07/22/2015   Colonoscopy  Never done    Past Medical History:  Diagnosis Date   Allergy    Anxiety    Arthritis    Bladder spasm    Depression    Endometriosis    Frequency of urination    Heart murmur    Herpes simplex    Hypertension    Interstitial cystitis    Migraines    Nocturia    Pelvic pain in female    Seizures (HCC)    Urgency of urination     Past Surgical History:  Procedure Laterality Date   AUGMENTATION MAMMAPLASTY  2009   IMPLANTS   CYSTO/ HOD/ INSTILLATION THERAPY  AUG 2011   ECTOPIC PREGNANCY SURGERY  2010   right salpingectomy   MULTIPLE LAPAROSCOPIES FOR ENDOMETRIOSIS  LAST ONE 2010   OOPHORECTOMY  2013   LSO   PELVIC LAPAROSCOPY     DL  LSO     Current Outpatient Medications:    acyclovir cream (ZOVIRAX) 5 %, Apply topically., Disp: , Rfl:    ALPRAZolam  (XANAX ) 0.5 MG tablet, Take 1 tablet (0.5 mg total) by mouth at bedtime as needed for anxiety., Disp: 7 tablet, Rfl: 0  Ascorbic Acid (VITAMIN C) 1000 MG tablet, Take 1,000 mg by mouth daily., Disp: , Rfl:    citalopram  (CELEXA ) 20 MG tablet, Take 1 tablet by mouth daily., Disp: , Rfl:    estradiol  (ESTRACE ) 1 MG tablet, Take 1 mg by mouth daily., Disp: , Rfl:    loratadine (CLARITIN) 10 MG tablet, Take 10 mg by mouth., Disp: , Rfl:    naloxone (NARCAN) nasal spray 4 mg/0.1 mL, Place 1 spray into the nose., Disp: , Rfl:    HYDROcodone -acetaminophen  (NORCO) 10-325 MG tablet, Take 1 tablet by mouth every 6 (six) hours as needed., Disp: 120 tablet, Rfl: 0  Allergies  Allergen Reactions   Citalopram  Palpitations    Brand change in Celexa  at pharmacy caused pt  to have diarrhea, heart palpatations, hands, arms,legs numb, loss of time, severe highs and lows, vision changes, unequal pupils, profuse sweating, insomnia, no period in 30 days.   Codeine Other (See Comments) and Itching    Itching, hives,   Diazepam  Swelling    Tongue/throat   Metoclopramide  Nausea And Vomiting, Nausea Only, Other (See Comments), Palpitations and Photosensitivity    Other reaction(s): Confusion (intolerance), Cramps (ALLERGY/intolerance), Sweating (intolerance)  Other Reaction(s): Confusion, Confusion/Altered Mental Status  Reaction: Diaphoresis / Sweating (intolerance) Reaction: Sweating (intolerance)     Other reaction(s): Confusion (intolerance), Cramps (ALLERGY/intolerance), Sweating (intolerance)   Tramadol  Anxiety, Diarrhea, Nausea And Vomiting, Nausea Only, Other (See Comments), Palpitations, Photosensitivity, Swelling and Tinitus    Muscle spasms  Other reaction(s): Arthralgias (intolerance), Bladder Spasms (intolerance), Chills (intolerance), Confusion (intolerance), Cramps (ALLERGY/intolerance), Delusions (intolerance), Diaphoresis / Sweating (intolerance), Dizziness (intolerance), Facial Edema (intolerance), Fever (intolerance), Flushing (ALLERGY/intolerance), Generalized Edema (intolerance), GI Upset (intolerance), Hypertension (intolerance), Lethargy (intolerance), Malaise (intolerance), Mental Status Changes (intolerance), Myalgias (intolerance), Psychosis (intolerance), Sweating (intolerance), Tremor (intolerance)  Other Reaction(s): Angioedema, Arthralgias, Diarrhea, Dizziness, Fatigue, Flushing, GI Intolerance, Hypertension, Hypotension, Mental Status Changes, Myalgias, Other (See Comments), Psychosis  Reaction: Bladder Spasms (intolerance) Reaction: Chills (intolerance) Reaction: Diaphoresis / Sweating (intolerance) Reaction: Fever (intolerance) Reaction: Sweating (intolerance) Reaction: Tremor (intolerance)  Other Reaction(s): Confusion, Confusion/Altered  Mental Status, Dizziness, Fever, Hypertension, Hypotension, Redness  Reaction: Bladder Spasms (intolerance) Reaction: Chills (intolerance) Reaction: Diaphoresis / Sweating (intolerance) Reaction: Fever (intolerance) Reaction: Sweating (intolerance) Reaction: Tremor (intolerance)     Muscle spasms  Other reaction(s): Arthralgias (intolerance), Bladder Spasms (intolerance), Chills (intolerance), Confusion (intolerance), Cramps (ALLERGY/intolerance), Delusions (intolerance), Diaphoresis / Sweating (intolerance), Dizziness (intolerance), Facial Edema (intolerance), Fever (intolerance), Flushing (ALLERGY/intolerance), Generalized Edema (intolerance), GI Upset (intolerance), Hypertension (intolerance), Lethargy (intolerance), Malaise (intolerance), Mental Status Changes (intolerance), Myalgias (intolerance), Psychosis (intolerance), Sweating (intolerance), Tremor (intolerance)  Other Reaction(s): Angioedema, Arthralgias, Diarrhea, Dizziness, Fatigue, Flushing, GI Intolerance, Hypertension, Hypotension, Mental Status Changes, Myalgias, Other (See Comments), Psychosis  Reaction: Bladder Spasms (intolerance) Reaction: Chills (intolerance) Reaction: Diaphoresis / Sweating (intolerance) Reaction: Fever (intolerance) Reaction: Sweating (intolerance) Reaction: Tremor (intolerance)   Amitriptyline Other (See Comments)    MUSCLE SPASMS   Ciprofloxacin Hives and Other (See Comments)    Other reaction(s): Other (See Comments)  Flu like sx's  Flu like sx's     Other reaction(s): Other (See Comments) Flu like sx's   Doxycycline Other (See Comments)    "flu-like symptoms."  "flu-like symptoms."     "flu-like symptoms."   Fluconazole  Other (See Comments)    Interacts with Celexa   Other reaction(s): Other (See Comments)  Interacts with Celexa     Interacts with Celexa  Other reaction(s): Other (See Comments) Interacts with Celexa    Latex Other (See Comments), Rash and Dermatitis  No latex use since age 63.  Yeast infection with condom use.   Metronidazole  Hcl Dermatitis and Other (See Comments)   Penicillins Hives and Other (See Comments)    Did it involve swelling of the face/tongue/throat, SOB, or low BP? Unknown  Did it involve sudden or severe rash/hives, skin peeling, or any reaction on the inside of your mouth or nose? No  Did you need to seek medical attention at a hospital or doctor's office? Y  When did it last happen? infant       If all above answers are "NO", may proceed with cephalosporin use.  Did it involve swelling of the face/tongue/throat, SOB, or low BP? Unknown Did it involve sudden or severe rash/hives, skin peeling, or any reaction on the inside of your mouth or nose? No Did you need to seek medical attention at a hospital or doctor's office? Y When did it last happen? infant      If all above answers are "NO", may proceed with cephalosporin use.   Shellfish Allergy Hives and Swelling   Sulfa Antibiotics Other (See Comments) and Hives    SEIZURES  Seizures    SEIZURES   Sulfamethoxazole Hives   ROS neg/noncontributory except as noted HPI/below      Objective:      BP 117/68   Pulse 88   Temp 98.4 F (36.9 C) (Temporal)   Resp 16   Ht 5' (1.524 m)   Wt 145 lb 2 oz (65.8 kg)   SpO2 96%   BMI 28.34 kg/m  Wt Readings from Last 3 Encounters:  06/01/23 145 lb 2 oz (65.8 kg)  05/13/23 144 lb 6 oz (65.5 kg)  05/05/23 144 lb (65.3 kg)    Physical Exam   Gen: WDWN NAD HEENT: NCAT, conjunctiva not injected, sclera nonicteric TM WNL B, OP moist, no exudates  NECK:  supple, no thyromegaly, no nodes, no carotid bruits CARDIAC: RRR, S1S2+, no murmur. DP 2+B LUNGS: CTAB. No wheezes EXT:  no edema MSK: no gross abnormalities.  NEURO: A&O x3.  CN II-XII intact.  PSYCH: normal mood. Good eye contact     Assessment & Plan:  Pelvic pain  IC (interstitial cystitis)  GAD (generalized anxiety disorder)  Screening for colorectal cancer -      Cologuard  Viral upper respiratory tract infection  Other orders -     HYDROcodone -Acetaminophen ; Take 1 tablet by mouth every 6 (six) hours as needed.  Dispense: 120 tablet; Refill: 0  Assessment and Plan Assessment & Plan Chronic pelvic pain/IC-was managed by urol but provider on extended LOA.  Bridging pt hydrocodone  until pain mgmt taking over.  Rx renewed  Anxiety-seeing psych.  Bad living situation.  Working on getting out   Chest congestion and rhinorrhea   She has recently developed chest congestion and rhinorrhea, mainly at night, with symptoms of chest tingling, rawness, and a liquid sensation. There is no dyspnea, significant cough, or fever. Differential diagnosis includes allergic rhinitis, viral upper respiratory infection, or early signs of an upper respiratory tract infection. COVID-19 test is negative. Symptoms may be too early for a definitive diagnosis. Monitor symptoms and contact if she worsens for potential antibiotic therapy. The provider is available on call this weekend for urgent issues.  Nicotine dependence, in remission   Nicotine dependence is in remission following smoking cessation two weeks ago. Previous smoking exacerbated lightheadedness and interfered with citalopram . She reports improved mental clarity and absence of palpitations since cessation.  Menopausal symptoms  She is experiencing menopausal symptoms, including irregular menses and vasomotor symptoms. Currently on estrogen therapy for hot flashes but is concerned about unopposed estrogen use due to an intact uterus. She is advised to consult with a gynecologist for hormone management.  General Health Maintenance   She is due for colorectal cancer screening at age 53 with no family history of colorectal cancer. She has the option for a Cologuard test; if negative, repeat in three years, and if positive, proceed with colonoscopy. Alternatively, she can proceed directly with colonoscopy.    Return  if symptoms worsen or fail to improve.  Ellsworth Haas, MD

## 2023-06-01 NOTE — Patient Instructions (Signed)

## 2023-06-02 ENCOUNTER — Other Ambulatory Visit: Payer: Self-pay | Admitting: Licensed Clinical Social Worker

## 2023-06-02 DIAGNOSIS — F411 Generalized anxiety disorder: Secondary | ICD-10-CM

## 2023-06-02 NOTE — Patient Outreach (Signed)
 Complex Care Management   Visit Note  06/02/2023  Name:  Melissa Sellers MRN: [id number] DOB: May 25, 1980  Situation: Referral received for Complex Care Management related to Mental/Behavioral Health diagnosis GAD I obtained verbal consent from Patient.  Visit completed with patient  on the phone  Background:   Past Medical History:  Diagnosis Date   Allergy    Anxiety    Arthritis    Bladder spasm    Depression    Endometriosis    Frequency of urination    Heart murmur    Herpes simplex    Hypertension    Interstitial cystitis    Migraines    Nocturia    Pelvic pain in female    Seizures (HCC)    Urgency of urination     Assessment: Patient Reported Symptoms:  Cognitive Cognitive Status: Alert and oriented to person, place, and time, Insightful and able to interpret abstract concepts, Normal speech and language skills Cognitive/Intellectual Conditions Management [RPT]: None reported or documented in medical history or problem list   Health Maintenance Behaviors: Stress management, Annual physical exam, Sleep adequate Health Facilitated by: Stress management  Neurological Neurological Review of Symptoms: No symptoms reported Neurological Management Strategies: Coping strategies, Counseling, Medication therapy  HEENT HEENT Symptoms Reported: No symptoms reported      Cardiovascular Cardiovascular Symptoms Reported: No symptoms reported Cardiovascular Management Strategies: Coping strategies, Activity Cardiovascular Self-Management Outcome: 4 (good)  Respiratory Respiratory Symptoms Reported: No symptoms reported    Endocrine Patient reports the following symptoms related to hypoglycemia or hyperglycemia : No symptoms reported    Gastrointestinal Gastrointestinal Symptoms Reported: No symptoms reported      Genitourinary Genitourinary Symptoms Reported: Other Other Genitourinary Symptoms: Pelvic pain, endometriosis - being managed by provider Genitourinary  Management Strategies: Activity, Coping strategies, Medication therapy Genitourinary Self-Management Outcome: 3 (uncertain)  Integumentary Integumentary Symptoms Reported: No symptoms reported    Musculoskeletal Musculoskelatal Symptoms Reviewed: No symptoms reported Musculoskeletal Management Strategies: Activity, Coping strategies, Medication therapy Musculoskeletal Self-Management Outcome: 3 (uncertain) Falls in the past year?: No Number of falls in past year: 1 or less Was there an injury with Fall?: No Fall Risk Category Calculator: 0 Patient Fall Risk Level: Low Fall Risk Patient at Risk for Falls Due to: No Fall Risks  Psychosocial Psychosocial Symptoms Reported: Anxiety - if selected complete GAD Behavioral Health Conditions: Anxiety Behavioral Management Strategies: Coping strategies, Counseling, Medication therapy, Support system Behavioral Health Self-Management Outcome: 4 (good) Behavioral Health Comment: Pt reports being very aware of her anxiety and what coping skills work for her Major Change/Loss/Stressor/Fears (CP): Environment, Relationship concerns, Resources Techniques to [employer] with Loss/Stress/Change: Counseling, Medication, Diversional activities Quality of Family Relationships: involved Do you feel physically threatened by others?: No      06/02/2023    2:05 PM  Depression screen PHQ 2/9  Decreased Interest 0  Down, Depressed, Hopeless 1  PHQ - 2 Score 1    There were no vitals filed for this visit.  Medications Reviewed Today     Reviewed by Jens Molder, LCSW (Social Worker) on 06/02/23 at 1351  Med List Status: <None>   Medication Order Taking? Sig Documenting Provider Last Dose Status Informant  acyclovir cream (ZOVIRAX) 5 % [id number] Yes Apply topically. [provider] Taking Active   ALPRAZolam  (XANAX ) 0.5 MG tablet [id number] Yes Take 1 tablet (0.5 mg total) by mouth at bedtime as needed for anxiety. Christel Cousins, MD Taking Active    Ascorbic Acid (VITAMIN C) 1000 MG tablet  [id number] Yes Take 1,000 mg by mouth daily. [provider] Taking Active   citalopram  (CELEXA ) 20 MG tablet [id number] Yes Take 1 tablet by mouth daily. [provider] Taking Active   estradiol  (ESTRACE ) 1 MG tablet [id number] Yes Take 1 mg by mouth daily. [provider] Taking Active   HYDROcodone -acetaminophen  (NORCO) 10-325 MG tablet [id number] Yes Take 1 tablet by mouth every 6 (six) hours as needed. Christel Cousins, MD Taking Active   loratadine (CLARITIN) 10 MG tablet [id number] Yes Take 10 mg by mouth. [provider] Taking Active   naloxone [facility]) nasal spray 4 mg/0.1 mL [id number]  Place 1 spray into the nose. [provider]  Active             Recommendation:   Continue to monitor for changes in mood - utilize coping skills as needed.  Follow Up Plan:   Telephone follow up appointment date/time:  06/30/2023  Hale Level, LCSW [place], [facility] Licensed Clinical Social Worker Care Coordinator [phone]

## 2023-06-08 ENCOUNTER — Encounter: Payer: Self-pay | Admitting: Family Medicine

## 2023-06-08 ENCOUNTER — Telehealth: Payer: Self-pay | Admitting: *Deleted

## 2023-06-08 ENCOUNTER — Other Ambulatory Visit: Payer: Self-pay | Admitting: Family Medicine

## 2023-06-08 MED ORDER — HYDROCODONE-ACETAMINOPHEN 10-325 MG PO TABS: 1.0000 | ORAL_TABLET | Freq: Four times a day (QID) | ORAL | 0 refills | Status: AC | PRN

## 2023-06-08 NOTE — Telephone Encounter (Signed)
 Copied from CRM [id number]. Topic: Clinical - Prescription Issue >> Jun 08, 2023  8:35 AM Kevelyn M wrote: Reason for CRM: HYDROcodone -acetaminophen  (NORCO) 10-325 MG tablet-Patient needs pain meds to be prescribed for 30 days instead of 5 days at a time. Because every 5 days she has to call in and it's very stressful. She stated the pharmacy is saying she has no refills left. It can not be refilled, it has to be a new prescription.

## 2023-06-14 ENCOUNTER — Telehealth: Payer: Self-pay | Admitting: *Deleted

## 2023-06-14 NOTE — Progress Notes (Signed)
 Complex Care Management Note  Care Guide Note 06/14/2023 Name: Melissa Sellers MRN: [id number] DOB: [birth year]  Melissa Sellers is a 43 y.o. year old female who sees Christel Cousins, MD for primary care. I reached out to Kenedi Coss by phone today to offer complex care management services.  [name] was given information about Complex Care Management services today including:   The Complex Care Management services include support from the care team which includes your Nurse Care Manager, Clinical Social Worker, or Pharmacist.  The Complex Care Management team is here to help remove barriers to the health concerns and goals most important to you. Complex Care Management services are voluntary, and the patient may decline or stop services at any time by request to their care team member.   Complex Care Management Consent Status: Patient agreed to services and verbal consent obtained.   Follow up plan:  Telephone appointment with complex care management team member scheduled for:  06/18/2023  Encounter Outcome:  Patient Scheduled  Kandis Ormond, CMA [place]  [facility], [facility] Guide Direct Dial: [phone]  Fax: [phone] Website: [email or web address]

## 2023-06-18 ENCOUNTER — Telehealth: Payer: Self-pay

## 2023-06-21 ENCOUNTER — Other Ambulatory Visit: Payer: Self-pay

## 2023-06-21 NOTE — Patient Outreach (Signed)
 Complex Care Management   Visit Note  06/21/2023  Name:  Melissa Sellers MRN: [id number] DOB: Jul 15, 1980  Situation: Referral received for Complex Care Management related to SDOH Barriers:  Housing income based Food insecurity Financial Resource Strain I obtained verbal consent from Patient.  Visit completed with patient  on the phone  Background:   Past Medical History:  Diagnosis Date   Allergy    Anxiety    Arthritis    Bladder spasm    Depression    Endometriosis    Frequency of urination    Heart murmur    Herpes simplex    Hypertension    Interstitial cystitis    Migraines    Nocturia    Pelvic pain in female    Seizures (HCC)    Urgency of urination     Assessment:  Patient reports that she and her 45 year old daughter lives with her mother and step father after being evicted in 2022.  Patient reports issues in the home and wants to relocate.  Patient has not been asked to leave so there is no crisis.  Patients only income is from [employer], $630 child support and $120 food assistance over the summer for her daughter.  Patient foodstamps lapsed in February and she has not recertified.  Patient eats with family, but due to illness in the household, she has not been able to cook, therefore using a large portion of her income for take out food. Patient has been denied disability in the past and wants to apply again.  SW refers patient to contact [employer] [facility] to assist with disability application, Foodstamps online link is provided to apply virtually, Archivist for [employer] and food bank, [employer] due to eviction to assist with housing options and [employer] online application link.    SDOH Interventions    Flowsheet Row Patient Outreach Telephone from 06/02/2023 in [facility] Office Visit from 05/05/2023 in [facility] [place] [facility] [place] [facility] Visit from 03/02/2023 in [facility] [place] [employer] Visit from 02/25/2022 in [facility] [place] [facility] [place] [employer] Visit from 10/01/2021 in [facility] [employer] [facility] [place]  SDOH Interventions       Food Insecurity Interventions [employer] Provided -- -- -- --  Housing Interventions [employer] Provided -- -- -- --  Development worker, community Provided  [employer] car but it's broken down before and been too expensive to fix] -- -- -- --  Depression Interventions/Treatment  -- Currently on Treatment PHQ2-9 Score <4 Follow-up Not Indicated Currently on Treatment Currently on Treatment         Recommendation:   None  Follow Up Plan:   Telephone follow up appointment date/time:  07/05/23 at 10am  Dallis Dues, BSW [place]  [facility], [facility] Social Worker Direct Dial: [phone]  Fax: [phone] Website: [email or web address]

## 2023-06-21 NOTE — Patient Instructions (Signed)
 Visit Information  Thank you for taking time to visit with me today. Please don't hesitate to contact me if I can be of assistance to you before our next scheduled appointment.  Our next appointment is by telephone on 07/05/23 at 10am Please call the care guide team at [phone] if you need to cancel or reschedule your appointment.   Following is a copy of your care plan:   Goals Addressed             This Visit's Progress    BSW-VBCI Social Work Care Plan       Problems:   Corporate treasurer , Geophysicist/field seismologist , and Housing   CSW Clinical Goal(s):   Over the next 14 days the Patient will will follow up with community resources as directed by Social Work.  Interventions:  Social Determinants of Health in Patient with migraines: SDOH assessments completed: Corporate treasurer , Geophysicist/field seismologist , and Housing  Evaluation of current treatment plan related to unmet needs SW referred patient to Foodstamps to apply online, [employer] for housing options, [employer] to apply online once waiting list is open, Development worker, international aid to apply for disability and Archivist for [employer].  Patient Goals/Self-Care Activities:  Coordinate with community resources to assist with housing, disability and food.  Plan:   Telephone follow up appointment with care management team member scheduled for:  07/05/23 at 10am.        Please call 911 if you are experiencing a Mental Health or Behavioral Health Crisis or need someone to talk to.  Patient verbalizes understanding of instructions and care plan provided today and agrees to view in MyChart. Active MyChart status and patient understanding of how to access instructions and care plan via MyChart confirmed with patient.     Dallis Dues, BSW [place]  [facility], [facility] Social Worker Direct Dial: [phone]  Fax: [phone] Website: [email or web address]

## 2023-06-27 ENCOUNTER — Encounter: Payer: Self-pay | Admitting: Family Medicine

## 2023-06-30 ENCOUNTER — Other Ambulatory Visit: Payer: Self-pay | Admitting: Licensed Clinical Social Worker

## 2023-06-30 NOTE — Patient Outreach (Signed)
 Complex Care Management   Visit Note  06/30/2023  Name:  Melissa Sellers MRN: [id number] DOB: 01/27/1980  Situation: Referral received for Complex Care Management related to Mental/Behavioral Health diagnosis GAD I obtained verbal consent from Patient.  Visit completed with patient  on the phone  Background:   Past Medical History:  Diagnosis Date   Allergy    Anxiety    Arthritis    Bladder spasm    Depression    Endometriosis    Frequency of urination    Heart murmur    Herpes simplex    Hypertension    Interstitial cystitis    Migraines    Nocturia    Pelvic pain in female    Seizures (HCC)    Urgency of urination     Assessment: Patient Reported Symptoms:  Cognitive Cognitive Status: Alert and oriented to person, place, and time, Normal speech and language skills, Insightful and able to interpret abstract concepts Cognitive/Intellectual Conditions Management [RPT]: None reported or documented in medical history or problem list   Health Maintenance Behaviors: Stress management  Neurological Neurological Review of Symptoms: No symptoms reported    HEENT HEENT Symptoms Reported: No symptoms reported      Cardiovascular Cardiovascular Symptoms Reported: No symptoms reported    Respiratory Respiratory Symptoms Reported: No symptoms reported    Endocrine Patient reports the following symptoms related to hypoglycemia or hyperglycemia : No symptoms reported    Gastrointestinal Gastrointestinal Symptoms Reported: No symptoms reported      Genitourinary Genitourinary Symptoms Reported: Not assessed    Integumentary Integumentary Symptoms Reported: Not assessed    Musculoskeletal Musculoskelatal Symptoms Reviewed: Not assessed        Psychosocial Psychosocial Symptoms Reported: Anxiety - if selected complete GAD Behavioral Health Conditions: Anxiety Behavioral Management Strategies: Coping strategies, Counseling, Support system Behavioral Health Self-Management  Outcome: 4 (good) Major Change/Loss/Stressor/Fears (CP): Environment, Relationship concerns, Resources Techniques to [employer] with Loss/Stress/Change: Counseling, Medication, Diversional activities Quality of Family Relationships: involved Do you feel physically threatened by others?: No      06/02/2023    2:05 PM  Depression screen PHQ 2/9  Decreased Interest 0  Down, Depressed, Hopeless 1  PHQ - 2 Score 1    There were no vitals filed for this visit.  Medications Reviewed Today     Reviewed by Jens Molder, LCSW (Social Worker) on 06/30/23 at 1420  Med List Status: <None>   Medication Order Taking? Sig Documenting Provider Last Dose Status Informant  acyclovir cream (ZOVIRAX) 5 % [id number] No Apply topically. [provider] Taking Active   ALPRAZolam  (XANAX ) 0.5 MG tablet [id number] No Take 1 tablet (0.5 mg total) by mouth at bedtime as needed for anxiety. Christel Cousins, MD Taking Active   Ascorbic Acid (VITAMIN C) 1000 MG tablet [id number] No Take 1,000 mg by mouth daily. [provider] Taking Active   citalopram  (CELEXA ) 20 MG tablet [id number] No Take 1 tablet by mouth daily. [provider] Taking Active   estradiol  (ESTRACE ) 1 MG tablet [id number] No Take 1 mg by mouth daily. [provider] Taking Active   HYDROcodone -acetaminophen  (NORCO) 10-325 MG tablet [id number]  Take 1 tablet by mouth every 6 (six) hours as needed. Christel Cousins, MD  Active   loratadine (CLARITIN) 10 MG tablet [id number] No Take 10 mg by mouth. [provider] Taking Active   naloxone [facility]) nasal spray 4 mg/0.1 mL [id number] No Place 1 spray into the nose. [provider] Taking Active  Recommendation:   Reviewed most recent stressors - pt is changing psychiatrist to Dr. Kieran Pellet with [facility]. Discussed getting established with a counselor through this group once establish with psychiatrist. Discussed her  coping skills - watching shows. Reviewed strategies for writing down stressors and organizing thoughts around them.   Follow Up Plan:   Telephone follow up appointment date/time:  07/28/2023  Hale Level, LCSW [place], [facility] Licensed Clinical Social Worker Care Coordinator [phone]

## 2023-06-30 NOTE — Patient Instructions (Signed)
 Visit Information  Thank you for taking time to visit with me today. Please don't hesitate to contact me if I can be of assistance to you before our next scheduled appointment.  Your next care management appointment is by telephone on 07/28/2023.   Please call the care guide team at [phone] if you need to cancel, schedule, or reschedule an appointment.   Please call the Suicide and Crisis Lifeline: 988 if you are experiencing a Mental Health or Behavioral Health Crisis or need someone to talk to.  Hale Level, LCSW [place], [facility] Licensed Clinical Social Worker Care Coordinator [phone]

## 2023-07-05 ENCOUNTER — Other Ambulatory Visit: Payer: Self-pay

## 2023-07-05 NOTE — Patient Instructions (Signed)
 Visit Information  Thank you for taking time to visit with me today. Please don't hesitate to contact me if I can be of assistance to you before our next scheduled appointment.  Your next care management appointment is by telephone on 08/04/23 at 11am   Please call the care guide team at [phone] if you need to cancel, schedule, or reschedule an appointment.   Please call 911 if you are experiencing a Mental Health or Behavioral Health Crisis or need someone to talk to.  Tillman Gardener, BSW [place]  [facility], [facility] Social Worker Direct Dial: [phone]  Fax: [phone] Website: [email or web address]

## 2023-07-05 NOTE — Patient Outreach (Signed)
 Complex Care Management   Visit Note  07/05/2023  Name:  Melissa Sellers MRN: [id number] DOB: 11-28-1980  Situation: Referral received for Complex Care Management related to SDOH Barriers:  Housing Public Housing Food insecurity Financial Resource Strain I obtained verbal consent from Patient.  Visit completed with patient  on the phone  Background:   Past Medical History:  Diagnosis Date   Allergy    Anxiety    Arthritis    Bladder spasm    Depression    Endometriosis    Frequency of urination    Heart murmur    Herpes simplex    Hypertension    Interstitial cystitis    Migraines    Nocturia    Pelvic pain in female    Seizures (HCC)    Urgency of urination     Assessment:  Patient reports she has been working overtime and is now in a lot of pain.  She has not been to [employer] for food assistance, but has completed the foodstamp application this weekend.  Patient also received $50 OTC card.  Patient has not contacted [facility].  Patient addressed car repairs.  SW discussed [employer], but patient is not currently participating with a service partner to do a referral.  Once patient is connected with [facility], she can request a referral.  Patient reports almost $200 storage bill.  Patient does plan to reduced storage by removing damaged items, contacting storage company with pictures to be reimbursed for damages, and purchasing a smaller unit.  SDOH Interventions    Flowsheet Row Patient Outreach Telephone from 06/02/2023 in [place] [facility] Office Visit from 05/05/2023 in [facility] Visit from 03/02/2023 in [facility] [place] [employer] Visit from 02/25/2022 in [facility] [place] [facility] [place] [employer] from 10/01/2021 in [facility] [employer] [facility] [place]  SDOH Interventions       Food Insecurity Interventions [employer]  Provided -- -- -- --  Housing Interventions [employer] Provided -- -- -- --  Development worker, community Provided  [employer] car but it's broken down before and been too expensive to fix] -- -- -- --  Depression Interventions/Treatment  -- Currently on Treatment PHQ2-9 Score <4 Follow-up Not Indicated Currently on Treatment Currently on Treatment      Recommendation:   None  Follow Up Plan:   Telephone follow up appointment date/time:  08/04/23 at 11am  Tillman Gardener, BSW [place]  [facility], [facility] Social Worker Direct Dial: [phone]  Fax: [phone] Website: [email or web address]

## 2023-07-06 ENCOUNTER — Ambulatory Visit: Payer: Self-pay | Admitting: Family Medicine

## 2023-07-06 DIAGNOSIS — R030 Elevated blood-pressure reading, without diagnosis of hypertension: Secondary | ICD-10-CM | POA: Diagnosis not present

## 2023-07-06 DIAGNOSIS — Z6829 Body mass index (BMI) 29.0-29.9, adult: Secondary | ICD-10-CM | POA: Diagnosis not present

## 2023-07-06 DIAGNOSIS — M419 Scoliosis, unspecified: Secondary | ICD-10-CM | POA: Diagnosis not present

## 2023-07-06 DIAGNOSIS — Z79899 Other long term (current) drug therapy: Secondary | ICD-10-CM | POA: Diagnosis not present

## 2023-07-06 DIAGNOSIS — M797 Fibromyalgia: Secondary | ICD-10-CM | POA: Diagnosis not present

## 2023-07-06 LAB — COLOGUARD: COLOGUARD: NEGATIVE

## 2023-07-13 DIAGNOSIS — Z6828 Body mass index (BMI) 28.0-28.9, adult: Secondary | ICD-10-CM | POA: Diagnosis not present

## 2023-07-13 DIAGNOSIS — Z8659 Personal history of other mental and behavioral disorders: Secondary | ICD-10-CM | POA: Diagnosis not present

## 2023-07-13 DIAGNOSIS — M419 Scoliosis, unspecified: Secondary | ICD-10-CM | POA: Diagnosis not present

## 2023-07-13 DIAGNOSIS — M797 Fibromyalgia: Secondary | ICD-10-CM | POA: Diagnosis not present

## 2023-07-13 DIAGNOSIS — M129 Arthropathy, unspecified: Secondary | ICD-10-CM | POA: Diagnosis not present

## 2023-07-13 DIAGNOSIS — Z79899 Other long term (current) drug therapy: Secondary | ICD-10-CM | POA: Diagnosis not present

## 2023-07-26 DIAGNOSIS — F17210 Nicotine dependence, cigarettes, uncomplicated: Secondary | ICD-10-CM | POA: Diagnosis not present

## 2023-07-26 DIAGNOSIS — Z6828 Body mass index (BMI) 28.0-28.9, adult: Secondary | ICD-10-CM | POA: Diagnosis not present

## 2023-07-26 DIAGNOSIS — R9431 Abnormal electrocardiogram [ECG] [EKG]: Secondary | ICD-10-CM | POA: Diagnosis not present

## 2023-07-26 DIAGNOSIS — R29818 Other symptoms and signs involving the nervous system: Secondary | ICD-10-CM | POA: Diagnosis not present

## 2023-07-26 DIAGNOSIS — R0602 Shortness of breath: Secondary | ICD-10-CM | POA: Diagnosis not present

## 2023-07-26 DIAGNOSIS — N809 Endometriosis, unspecified: Secondary | ICD-10-CM | POA: Diagnosis not present

## 2023-07-26 DIAGNOSIS — F32A Depression, unspecified: Secondary | ICD-10-CM | POA: Diagnosis not present

## 2023-07-26 DIAGNOSIS — M797 Fibromyalgia: Secondary | ICD-10-CM | POA: Diagnosis not present

## 2023-07-26 DIAGNOSIS — M419 Scoliosis, unspecified: Secondary | ICD-10-CM | POA: Diagnosis not present

## 2023-07-26 DIAGNOSIS — Z1339 Encounter for screening examination for other mental health and behavioral disorders: Secondary | ICD-10-CM | POA: Diagnosis not present

## 2023-07-26 DIAGNOSIS — Z79899 Other long term (current) drug therapy: Secondary | ICD-10-CM | POA: Diagnosis not present

## 2023-07-26 DIAGNOSIS — E663 Overweight: Secondary | ICD-10-CM | POA: Diagnosis not present

## 2023-07-28 ENCOUNTER — Other Ambulatory Visit: Payer: Self-pay | Admitting: Licensed Clinical Social Worker

## 2023-07-28 DIAGNOSIS — Z1339 Encounter for screening examination for other mental health and behavioral disorders: Secondary | ICD-10-CM | POA: Diagnosis not present

## 2023-07-28 DIAGNOSIS — R0602 Shortness of breath: Secondary | ICD-10-CM | POA: Diagnosis not present

## 2023-07-28 DIAGNOSIS — F411 Generalized anxiety disorder: Secondary | ICD-10-CM | POA: Diagnosis not present

## 2023-07-28 DIAGNOSIS — Z1331 Encounter for screening for depression: Secondary | ICD-10-CM | POA: Diagnosis not present

## 2023-07-28 DIAGNOSIS — Z6828 Body mass index (BMI) 28.0-28.9, adult: Secondary | ICD-10-CM | POA: Diagnosis not present

## 2023-07-28 DIAGNOSIS — F32A Depression, unspecified: Secondary | ICD-10-CM | POA: Diagnosis not present

## 2023-07-28 NOTE — Patient Outreach (Signed)
 Complex Care Management   Visit Note  07/28/2023  Name:  Melissa Sellers MRN: [id number] DOB: 01/26/1980  Situation: Referral received for Complex Care Management related to Mental/Behavioral Health diagnosis Anxiety I obtained verbal consent from Patient.  Visit completed with patient  on the phone  Background:   Past Medical History:  Diagnosis Date   Allergy    Anxiety    Arthritis    Bladder spasm    Depression    Endometriosis    Frequency of urination    Heart murmur    Herpes simplex    Hypertension    Interstitial cystitis    Migraines    Nocturia    Pelvic pain in female    Seizures (HCC)    Urgency of urination     Assessment: Patient Reported Symptoms:  Cognitive Cognitive Status: Alert and oriented to person, place, and time, Normal speech and language skills, Insightful and able to interpret abstract concepts Cognitive/Intellectual Conditions Management [RPT]: None reported or documented in medical history or problem list   Health Maintenance Behaviors: Stress management  Neurological Neurological Review of Symptoms: No symptoms reported Neurological Management Strategies: Coping strategies, Counseling, Medication therapy  HEENT HEENT Symptoms Reported: No symptoms reported      Cardiovascular Cardiovascular Symptoms Reported: No symptoms reported Does patient have uncontrolled Hypertension?: No Cardiovascular Management Strategies: Coping strategies, Activity Cardiovascular Comment: Pt is being referred to cardiologist by pain clinic - pt reports ECG detected some possible heart issues  Respiratory Respiratory Symptoms Reported: No symptoms reported    Endocrine Endocrine Symptoms Reported: No symptoms reported    Gastrointestinal Gastrointestinal Symptoms Reported: No symptoms reported      Genitourinary Genitourinary Symptoms Reported: Not assessed    Integumentary Integumentary Symptoms Reported: No symptoms reported    Musculoskeletal  Additional Musculoskeletal Details: Pt is now being seen at [facility] - Musculoskeletal Management Strategies: Activity, Coping strategies, Medication therapy Musculoskeletal Self-Management Outcome: 3 (uncertain) Falls in the past year?: No Number of falls in past year: 1 or less Was there an injury with Fall?: No Fall Risk Category Calculator: 0 Patient Fall Risk Level: Low Fall Risk    Psychosocial Psychosocial Symptoms Reported: Anxiety - if selected complete GAD Additional Psychological Details: Pt is being seen by counselor with [facility] - reports this has been very helpful for her and she plans on making her a lngterm counselor Behavioral Management Strategies: Coping strategies, Counseling, Support system Behavioral Health Self-Management Outcome: 4 (good) Major Change/Loss/Stressor/Fears (CP): Environment, Relationship concerns, Resources Techniques to [place] with Loss/Stress/Change: Counseling, Medication, Diversional activities Quality of Family Relationships: involved Do you feel physically threatened by others?: No      07/28/2023    3:21 PM  Depression screen PHQ 2/9  Decreased Interest 0  Down, Depressed, Hopeless 1  PHQ - 2 Score 1    There were no vitals filed for this visit.  Medications Reviewed Today     Reviewed by Kit Alm LABOR, LCSW (Social Worker) on 07/28/23 at 1526  Med List Status: <None>   Medication Order Taking? Sig Documenting Provider Last Dose Status Informant  acyclovir cream (ZOVIRAX) 5 % [id number] Yes Apply topically. [provider]  Active   ALPRAZolam  (XANAX ) 0.5 MG tablet [id number] Yes Take 1 tablet (0.5 mg total) by mouth at bedtime as needed for anxiety. Wendolyn Jenkins Jansky, MD  Active   Ascorbic Acid (VITAMIN C) 1000 MG tablet [id number] Yes Take 1,000 mg by mouth daily. [provider]  Active   citalopram  (CELEXA ) 20 MG tablet [id number] Yes Take 1 tablet by mouth daily.  [provider]  Active   estradiol  (ESTRACE ) 1 MG tablet [id number] Yes Take 1 mg by mouth daily. [provider]  Active   HYDROcodone -acetaminophen  (NORCO) 10-325 MG tablet [id number] Yes Take 1 tablet by mouth every 6 (six) hours as needed. Wendolyn Jenkins Jansky, MD  Active   loratadine (CLARITIN) 10 MG tablet [id number] Yes Take 10 mg by mouth. [provider]  Active   naloxone [facility]) nasal spray 4 mg/0.1 mL [id number] Yes Place 1 spray into the nose. [provider]  Active             Recommendation:   Pt started seeing [facility] for her pain clinic - through [facility] she was able to establish with a counselor - she plans on seeing this counselor long-term as she felt she was able to make progress with this person. CSW will follow up with pt again to ensure provider follow up. Pt is hopeful that with appropriate pain control and MH support, she will be able to better manage her regular stressors.  Follow Up Plan:   Telephone follow up appointment date/time:  08/30/2023  Alm Armor, LCSW [place], [facility] Licensed Clinical Social Worker Care Coordinator [phone]

## 2023-07-28 NOTE — Patient Instructions (Signed)
 Visit Information  Thank you for taking time to visit with me today. Please don't hesitate to contact me if I can be of assistance to you before our next scheduled appointment.  Your next care management appointment is by telephone on 08/30/2023    Please call the care guide team at [phone] if you need to cancel, schedule, or reschedule an appointment.   Please call the Suicide and Crisis Lifeline: 988 if you are experiencing a Mental Health or Behavioral Health Crisis or need someone to talk to.  Alm Armor, LCSW [place], [facility] Licensed Clinical Social Worker Care Coordinator [phone]

## 2023-08-04 ENCOUNTER — Telehealth: Payer: Self-pay

## 2023-08-06 ENCOUNTER — Telehealth: Payer: Self-pay

## 2023-08-19 DIAGNOSIS — F17210 Nicotine dependence, cigarettes, uncomplicated: Secondary | ICD-10-CM | POA: Diagnosis not present

## 2023-08-19 DIAGNOSIS — R942 Abnormal results of pulmonary function studies: Secondary | ICD-10-CM | POA: Diagnosis not present

## 2023-08-19 DIAGNOSIS — M419 Scoliosis, unspecified: Secondary | ICD-10-CM | POA: Diagnosis not present

## 2023-08-23 DIAGNOSIS — M419 Scoliosis, unspecified: Secondary | ICD-10-CM | POA: Diagnosis not present

## 2023-08-23 DIAGNOSIS — Z79899 Other long term (current) drug therapy: Secondary | ICD-10-CM | POA: Diagnosis not present

## 2023-08-23 DIAGNOSIS — F17210 Nicotine dependence, cigarettes, uncomplicated: Secondary | ICD-10-CM | POA: Diagnosis not present

## 2023-08-23 DIAGNOSIS — M797 Fibromyalgia: Secondary | ICD-10-CM | POA: Diagnosis not present

## 2023-08-23 DIAGNOSIS — M25562 Pain in left knee: Secondary | ICD-10-CM | POA: Diagnosis not present

## 2023-08-23 DIAGNOSIS — I10 Essential (primary) hypertension: Secondary | ICD-10-CM | POA: Diagnosis not present

## 2023-08-23 DIAGNOSIS — Z6829 Body mass index (BMI) 29.0-29.9, adult: Secondary | ICD-10-CM | POA: Diagnosis not present

## 2023-08-27 DIAGNOSIS — Z6828 Body mass index (BMI) 28.0-28.9, adult: Secondary | ICD-10-CM | POA: Diagnosis not present

## 2023-08-27 DIAGNOSIS — F411 Generalized anxiety disorder: Secondary | ICD-10-CM | POA: Diagnosis not present

## 2023-08-27 DIAGNOSIS — F32A Depression, unspecified: Secondary | ICD-10-CM | POA: Diagnosis not present

## 2023-08-30 ENCOUNTER — Other Ambulatory Visit: Payer: Self-pay | Admitting: Licensed Clinical Social Worker

## 2023-08-30 ENCOUNTER — Encounter: Payer: Self-pay | Admitting: Licensed Clinical Social Worker

## 2023-08-30 ENCOUNTER — Ambulatory Visit: Payer: Self-pay

## 2023-08-30 NOTE — Patient Instructions (Signed)
 Visit Information  Melissa Sellers was given information about Medicaid Managed Care team care coordination services as a part of their [employer] Medicaid benefit.   If you would like to schedule transportation through your [employer] [facility] plan, please call the following number at least 2 days in advance of your appointment: [phone]  If you are experiencing a behavioral health crisis, call the [employer] [place]  Behavioral Health Crisis Line at [phone]). The line is available 24 hours a day, seven days a week.   Melissa Sellers - following are the goals we discussed in your visit today:    Goals Addressed             This Visit's Progress    COMPLETED: VBCI Social Work Care Plan       Problems:   Disease Management support and education needs related to Anxiety with Excessive Worry, and Financial Strain   CSW Clinical Goal(s):   Over the next 6 weeks the Patient will demonstrate a reduction in symptoms related to Anxiety with Excessive Worry,  explore community resource options for unmet needs related to Financial Strain .  Interventions:  Social Determinants of Health in Patient with Anxiety with Excessive Worry,: SDOH assessments completed: Financial Strain  Evaluation of current treatment plan related to unmet needs Referral to [facility] Guide (06/02/23) Mental Health:  Evaluation of current treatment plan related to Anxiety with Excessive Worry, Active listening / Reflection utilized Behavioral Activation reviewed Emotional Support Provided Motivational Interviewing employed Problem Solving /Task Center strategies reviewed Provided general psycho-education for mental health needs Solution-Focued Strategies employed:  Patient Goals/Self-Care Activities:  Collaborate with the community resource care guide for assistance with finanical strain. Continue taking your medication as prescribed.    Monitor mood and utilize coping skills as needed  06/30/2023 - Reviewed most recent stressors - pt is changing psychiatrist to Dr. Jess with [facility]. Discussed getting established with a counselor through this group once establish with psychiatrist. Discussed her coping skills - watching shows. Reviewed strategies for writing down stressors and organizing thoughts around them.  07/28/2023 - Pt started seeing [facility] for her pain clinic - through [facility] she was able to establish with a counselor - she plans on seeing this counselor long-term as she felt she was able to make progress with this person. CSW will follow up with pt again to ensure provider follow up. Pt is hopeful that with appropriate pain control and MH support, she will be able to better manage her regular stressors.  Plan:   Telephone follow up appointment with care management team member scheduled for:  06/30/2023        Patient verbalizes understanding of instructions and care plan provided today and agrees to view in MyChart. Active MyChart status and patient understanding of how to access instructions and care plan via MyChart confirmed with patient.     Social Worker will leave episode open for follow up by BSW.   Alm Armor, LCSW [place], [facility] Licensed Clinical Social Worker Care Coordinator [phone]   Following is a copy of your plan of care:  There are no care plans that you recently modified to display for this patient.

## 2023-08-30 NOTE — Patient Outreach (Signed)
 Complex Care Management   Visit Note  08/30/2023  Name:  Melissa Sellers MRN: [id number] DOB: 11-15-1980  Situation: Referral received for Complex Care Management related to Mental/Behavioral Health diagnosis Anxiety I obtained verbal consent from Patient.  Visit completed with patient  on the phone  Background:   Past Medical History:  Diagnosis Date   Allergy    Anxiety    Arthritis    Bladder spasm    Depression    Endometriosis    Frequency of urination    Heart murmur    Herpes simplex    Hypertension    Interstitial cystitis    Migraines    Nocturia    Pelvic pain in female    Seizures (HCC)    Urgency of urination     Assessment: Patient Reported Symptoms:  Cognitive Cognitive Status: Alert and oriented to person, place, and time, Normal speech and language skills, Insightful and able to interpret abstract concepts Cognitive/Intellectual Conditions Management [RPT]: None reported or documented in medical history or problem list   Health Maintenance Behaviors: Stress management, Annual physical exam  Neurological Neurological Review of Symptoms: No symptoms reported    HEENT HEENT Symptoms Reported: No symptoms reported      Cardiovascular Cardiovascular Symptoms Reported: No symptoms reported Does patient have uncontrolled Hypertension?: No Cardiovascular Management Strategies: Coping strategies, Activity Cardiovascular Self-Management Outcome: 4 (good)  Respiratory Respiratory Symptoms Reported: No symptoms reported    Endocrine Endocrine Symptoms Reported: No symptoms reported    Gastrointestinal Gastrointestinal Symptoms Reported: No symptoms reported      Genitourinary Genitourinary Symptoms Reported: Not assessed    Integumentary Integumentary Symptoms Reported: No symptoms reported    Musculoskeletal Musculoskelatal Symptoms Reviewed: Joint pain Additional Musculoskeletal Details: Being seen at [facility]- patient reports she is  developing a good relationship with this clinic Musculoskeletal Management Strategies: Activity, Coping strategies, Medication therapy Musculoskeletal Self-Management Outcome: 3 (uncertain)      Psychosocial Psychosocial Symptoms Reported: Anxiety - if selected complete GAD Additional Psychological Details: Pt is being seen by counselor with [facility] - reports this has been very helpful for her and she plans on making her a longterm counselor Behavioral Management Strategies: Coping strategies, Counseling, Support system Behavioral Health Self-Management Outcome: 4 (good)          07/28/2023    3:21 PM  Depression screen PHQ 2/9  Decreased Interest 0  Down, Depressed, Hopeless 1  PHQ - 2 Score 1    There were no vitals filed for this visit.  Medications Reviewed Today     Reviewed by Kit Alm LABOR, LCSW (Social Worker) on 08/30/23 at 1125  Med List Status: <None>   Medication Order Taking? Sig Documenting Provider Last Dose Status Informant  acyclovir cream (ZOVIRAX) 5 % [id number] Yes Apply topically. [provider]  Active   ALPRAZolam  (XANAX ) 0.5 MG tablet [id number] Yes Take 1 tablet (0.5 mg total) by mouth at bedtime as needed for anxiety. Wendolyn Jenkins Jansky, MD  Active   Ascorbic Acid (VITAMIN C) 1000 MG tablet [id number] Yes Take 1,000 mg by mouth daily. [provider]  Active   citalopram  (CELEXA ) 20 MG tablet [id number] Yes Take 1 tablet by mouth daily. [provider]  Active   estradiol  (ESTRACE ) 1 MG tablet [id number] Yes Take 1 mg by mouth daily. [provider]  Active   HYDROcodone -acetaminophen  (NORCO) 10-325 MG tablet [id number] Yes Take 1 tablet by mouth every 6 (six) hours as needed. Wendolyn Jenkins  Earnie, MD  Active   loratadine (CLARITIN) 10 MG tablet [id number] Yes Take 10 mg by mouth. [provider]  Active   naloxone [facility]) nasal spray 4 mg/0.1 mL [id number] Yes Place 1 spray into the nose.  [provider]  Active             Recommendation:   Continue Current Plan of Care  Follow Up Plan:   Patient has met all care management goals. Care Management case will be closed. Patient has been provided contact information should new needs arise.  Episode will remain open for follow up by BSW for resource needs.  Alm Armor, LCSW [place], [facility] Licensed Clinical Social Worker Care Coordinator [phone]

## 2023-08-30 NOTE — Telephone Encounter (Signed)
 FYI Only or Action Required?: Action required by provider: Requesting P[facility]id to [facility] on [address].  Patient was last seen in primary care on 06/01/2023 by Wendolyn Jenkins Jansky, MD.  Called Nurse Triage reporting Covid Positive.  Symptoms began several days ago.  Interventions attempted: OTC medications: Vitamin C and Rest, hydration, or home remedies.  Symptoms are: unchanged.  Triage Disposition: Home Care  Patient/caregiver understands and will follow disposition?: Yes   Copied from CRM #[id number]. Topic: Clinical - Red Word Triage >> Aug 30, 2023 11:42 AM Melissa Sellers wrote: [facility] that prompted transfer to Nurse Triage: Patient is having severe pain and aches throughout body. She took a home COVID test and it tested positive. She is having congestion, fever, headaches. Temperature maintaining at about 100. She has thrown up this weekend. She is also having a gurgling noise with her GI tract. Reason for Disposition  [1] COVID-19 diagnosed by positive lab test (e.g., PCR, rapid self-test kit) AND [2] mild symptoms (e.g., cough, fever, others) AND [3] no complications or SOB  Answer Assessment - Initial Assessment Questions Tested positive on Sunday. Staying hydrated and taking vitamin C. [facility] [address] Patient would like a call back if medication can be sent or not.    1. SYMPTOMS: What is your main symptom or concern? (e.g., cough, Head aches fever, shortness of breath, muscle aches)     Body aches, congestion, headaches, loss sense of smell, vomited Sunday AM  2. ONSET: When did the symptoms start?      Friday  3. COUGH: Do you have a cough? If Yes, ask: How bad is the cough?        Not that bad  4. FEVER: Do you have a fever? If Yes, ask: What is your temperature, how was it measured, and when did it start?     Early this morning 99.8  5. BREATHING DIFFICULTY: Are you having any difficulty breathing? (e.g., normal;  shortness of breath, wheezing, unable to speak)      No  6. BETTER-SAME-WORSE: Are you getting better, staying the same or getting worse compared to yesterday?  If getting worse, ask, In what way?     States slightly worse   11. HIGH RISK DISEASE: Do you have any chronic medical problems? (e.g., asthma, heart or lung disease, weak immune system, obesity, etc.)       unknown  Protocols used: COVID-19 - Diagnosed or Suspected-A-AH

## 2023-08-30 NOTE — Telephone Encounter (Signed)
 Patient need a vv or e-visit with someone that has openings.

## 2023-08-31 ENCOUNTER — Ambulatory Visit: Admitting: Physician Assistant

## 2023-09-09 ENCOUNTER — Telehealth: Payer: Self-pay | Admitting: Licensed Clinical Social Worker

## 2023-09-15 ENCOUNTER — Encounter: Payer: Self-pay | Admitting: Licensed Clinical Social Worker

## 2023-09-21 ENCOUNTER — Other Ambulatory Visit: Payer: Self-pay

## 2023-09-21 NOTE — Patient Outreach (Addendum)
 Complex Care Management   Visit Note  09/21/2023  Name:  Melissa Sellers MRN: [id number] DOB: [birth year]  Situation: Referral received for Complex Care Management related to SDOH Barriers:  Housing affordable Food insecurity Financial Resource Strain I obtained verbal consent from Patient.  Visit completed with Patient  on the phone  Background:   Past Medical History:  Diagnosis Date   Allergy    Anxiety    Arthritis    Bladder spasm    Depression    Endometriosis    Frequency of urination    Heart murmur    Herpes simplex    Hypertension    Interstitial cystitis    Migraines    Nocturia    Pelvic pain in female    Seizures (HCC)    Urgency of urination     Assessment:  Patient reports that she has not submitted her Foodstamp application and has been overwhelmed over the past month. SW uses active listening with patient regarding her feelings of hopelessness. Patient has COVID and has not been able to work.  Patient obtained funds through a credit card to pay for food and car repairs.  Both cards are now maxed out.  SW does stress the importance of applying for Foodstamps as it takes 30-45 days for approval.  SW does suggest counseling but patient declines referral to [employer] LCSW to establish a therapist. Patient request follow up in 2 weeks to address goals. Patient agreed to attempt the online application by the weekend but is interested in going into the office.  SW was not able to update the assessment.  SDOH Interventions    Flowsheet Row Patient Outreach Telephone from 06/02/2023 in [place] [facility] DEPART[facility] Office Visit from 05/05/2023 in [facility] [place] [facility] Visit from 03/02/2023 in [facility] [place] [employer] Visit from 02/25/2022 in [facility] [place] [facility] [place] [employer] Visit from 10/01/2021 in [facility] [employer] [place]  SDOH Interventions        Food Insecurity Interventions [employer] Provided -- -- -- --  Housing Interventions [employer] Provided -- -- -- --  Development worker, community Provided  [employer] car but it's broken down before and been too expensive to fix] -- -- -- --  Depression Interventions/Treatment  -- Currently on Treatment PHQ2-9 Score <4 Follow-up Not Indicated Currently on Treatment Currently on Treatment    Recommendation:   None  Follow Up Plan:   Telephone follow up appointment date/time:  10/05/23 at 1pm  Tillman Gardener, BSW [place]  [facility], [facility] Social Worker Direct Dial: [phone]  Fax: [phone] Website: [email or web address]

## 2023-09-21 NOTE — Patient Instructions (Signed)
 Visit Information  Thank you for taking time to visit with me today. Please don't hesitate to contact me if I can be of assistance to you before our next scheduled appointment.  Your next care management appointment is by telephone on 10/05/23 at 1pm    Please call the care guide team at [phone] if you need to cancel, schedule, or reschedule an appointment.   Please call 911 if you are experiencing a Mental Health or Behavioral Health Crisis or need someone to talk to.  Tillman Gardener, BSW [place]  [facility], [facility] Social Worker Direct Dial: [phone]  Fax: [phone] Website: [email or web address]

## 2023-09-22 DIAGNOSIS — Z79899 Other long term (current) drug therapy: Secondary | ICD-10-CM | POA: Diagnosis not present

## 2023-09-22 DIAGNOSIS — M25562 Pain in left knee: Secondary | ICD-10-CM | POA: Diagnosis not present

## 2023-09-22 DIAGNOSIS — Z124 Encounter for screening for malignant neoplasm of cervix: Secondary | ICD-10-CM | POA: Diagnosis not present

## 2023-09-22 DIAGNOSIS — M419 Scoliosis, unspecified: Secondary | ICD-10-CM | POA: Diagnosis not present

## 2023-09-22 DIAGNOSIS — M797 Fibromyalgia: Secondary | ICD-10-CM | POA: Diagnosis not present

## 2023-09-22 DIAGNOSIS — Z6828 Body mass index (BMI) 28.0-28.9, adult: Secondary | ICD-10-CM | POA: Diagnosis not present

## 2023-09-22 DIAGNOSIS — N809 Endometriosis, unspecified: Secondary | ICD-10-CM | POA: Diagnosis not present

## 2023-09-22 DIAGNOSIS — I10 Essential (primary) hypertension: Secondary | ICD-10-CM | POA: Diagnosis not present

## 2023-09-22 DIAGNOSIS — Z1231 Encounter for screening mammogram for malignant neoplasm of breast: Secondary | ICD-10-CM | POA: Diagnosis not present

## 2023-09-28 DIAGNOSIS — F411 Generalized anxiety disorder: Secondary | ICD-10-CM | POA: Diagnosis not present

## 2023-09-28 DIAGNOSIS — F32A Depression, unspecified: Secondary | ICD-10-CM | POA: Diagnosis not present

## 2023-10-03 ENCOUNTER — Encounter: Payer: Self-pay | Admitting: Family Medicine

## 2023-10-04 DIAGNOSIS — G4710 Hypersomnia, unspecified: Secondary | ICD-10-CM | POA: Diagnosis not present

## 2023-10-04 DIAGNOSIS — M797 Fibromyalgia: Secondary | ICD-10-CM | POA: Diagnosis not present

## 2023-10-04 DIAGNOSIS — Z6828 Body mass index (BMI) 28.0-28.9, adult: Secondary | ICD-10-CM | POA: Diagnosis not present

## 2023-10-04 DIAGNOSIS — I10 Essential (primary) hypertension: Secondary | ICD-10-CM | POA: Diagnosis not present

## 2023-10-04 DIAGNOSIS — M419 Scoliosis, unspecified: Secondary | ICD-10-CM | POA: Diagnosis not present

## 2023-10-05 ENCOUNTER — Other Ambulatory Visit: Payer: Self-pay

## 2023-10-05 NOTE — Patient Instructions (Signed)
 Visit Information  Thank you for taking time to visit with me today. Please don't hesitate to contact me if I can be of assistance to you before our next scheduled appointment.  Your next care management appointment is by telephone on 10/13/23 at 1pm  Please call the care guide team at [phone] if you need to cancel, schedule, or reschedule an appointment.   Please call 911 if you are experiencing a Mental Health or Behavioral Health Crisis or need someone to talk to.  Tillman Gardener, BSW [place]  [facility], [facility] Social Worker Direct Dial: [phone]  Fax: [phone] Website: [email or web address]

## 2023-10-05 NOTE — Patient Outreach (Signed)
 Complex Care Management   Visit Note  10/05/2023  Name:  Melissa Sellers MRN: [id number] DOB: September 09, 1980  Situation: Referral received for Complex Care Management related to SDOH Barriers:  Food insecurity I obtained verbal consent from Patient.  Visit completed with Patient  on the phone  Background:   Past Medical History:  Diagnosis Date   Allergy    Anxiety    Arthritis    Bladder spasm    Depression    Endometriosis    Frequency of urination    Heart murmur    Herpes simplex    Hypertension    Interstitial cystitis    Migraines    Nocturia    Pelvic pain in female    Seizures (HCC)    Urgency of urination     Assessment:  Patient reports that she has not submitted her Foodstamp application and has been overwhelmed over the past 2 weeks. Patient plans to go to DSS to apply.  Patient is concerned about how to report income changes to show she is no longer making the same amount of income. SW uses active listening with patient regarding her feelings of being overwhelmed because of the many medical providers she has to see. Patient request follow up in 1 weeks to address goals. Assessment will be updated at next visit.  SDOH Interventions    Flowsheet Row Patient Outreach Telephone from 06/02/2023 in [place] POPULATION HEALTH DEPARTMENT Office Visit from 05/05/2023 in [facility] [place] [facility] [place] [facility] Visit from 03/02/2023 in [facility] [facility] [place] [employer] Visit from 02/25/2022 in [facility] [place] [employer] Visit from 10/01/2021 in [facility] [employer] [facility] [place]  SDOH Interventions       Food Insecurity Interventions [employer] Provided -- -- -- --  Housing Interventions [employer] Provided -- -- -- --  Development worker, community Provided  [employer] car but it's broken down before and been too expensive to fix] -- -- -- --  Depression  Interventions/Treatment  -- Currently on Treatment PHQ2-9 Score <4 Follow-up Not Indicated Currently on Treatment Currently on Treatment      Recommendation:   None  Follow Up Plan:   Telephone follow up appointment date/time:  10/13/23  Tillman Carolynn HEDWIG [facility]  [facility], [facility] Social Worker Direct Dial: [phone]  Fax: [phone] Website: [email or web address]

## 2023-10-12 DIAGNOSIS — N809 Endometriosis, unspecified: Secondary | ICD-10-CM | POA: Diagnosis not present

## 2023-10-12 DIAGNOSIS — Z6829 Body mass index (BMI) 29.0-29.9, adult: Secondary | ICD-10-CM | POA: Diagnosis not present

## 2023-10-12 DIAGNOSIS — Z01419 Encounter for gynecological examination (general) (routine) without abnormal findings: Secondary | ICD-10-CM | POA: Diagnosis not present

## 2023-10-12 DIAGNOSIS — Z7251 High risk heterosexual behavior: Secondary | ICD-10-CM | POA: Diagnosis not present

## 2023-10-13 ENCOUNTER — Other Ambulatory Visit: Payer: Self-pay

## 2023-10-13 NOTE — Patient Outreach (Signed)
 Complex Care Management   Visit Note  10/13/2023  Name:  Melissa Sellers MRN: [id number] DOB: 04/27/1980  Situation: Referral received for Complex Care Management related to SDOH Barriers:  Food insecurity Financial Resource Strain I obtained verbal consent from Patient.  Visit completed with Patient  on the phone  Background:   Past Medical History:  Diagnosis Date   Allergy    Anxiety    Arthritis    Bladder spasm    Depression    Endometriosis    Frequency of urination    Heart murmur    Herpes simplex    Hypertension    Interstitial cystitis    Migraines    Nocturia    Pelvic pain in female    Seizures (HCC)    Urgency of urination     Assessment:  Patient reports that she has not submitted her Foodstamp application. Completes assessment. Patient is interested in disability and SW provides number to [employer]. Patient has not worked for [employer] in a while. Patients daughter has car to assist with errands.   SDOH Interventions    Flowsheet Row Patient Outreach Telephone from 10/13/2023 in [place] POPULATION HEALTH DEPARTMENT Patient Outreach Telephone from 06/02/2023 in [place] POPULATION HEALTH DEPARTMENT Office Visit from 05/05/2023 in [facility] [place] [facility] [place] [employer] Visit from 03/02/2023 in [facility] [place] [employer] Visit from 02/25/2022 in [facility] [facility] [place] [employer] Visit from 10/01/2021 in [facility] [employer] [facility] [place]  SDOH Interventions        Food Insecurity Interventions Other (Comment)  [Food Radiographer, therapeutic and directed to apply for foodstamps] [employer] Provided -- -- -- --  Housing Interventions Intervention Not Indicated [employer] Provided -- -- -- --  Transportation Interventions Intervention Not Indicated  [Has vehicle but needs repairs and educated on [employer] transportation] [employer] Provided  [employer] car but  it's broken down before and been too expensive to fix] -- -- -- --  Utilities Interventions Intervention Not Indicated -- -- -- -- --  Depression Interventions/Treatment  -- -- Currently on Treatment PHQ2-9 Score <4 Follow-up Not Indicated Currently on Treatment Currently on Treatment  Financial Strain Interventions Intervention Not Indicated  [in debit due and disability expired and will reapply] -- -- -- -- --      Recommendation:   None  Follow Up Plan:   Telephone follow up appointment date/time:  10/27/23 at 1pm  Tillman Gardener, BSW [place]  [facility], [facility] Social Worker Direct Dial: [phone]  Fax: [phone] Website: [email or web address]

## 2023-10-13 NOTE — Patient Instructions (Signed)
 Visit Information  Thank you for taking time to visit with me today. Please don't hesitate to contact me if I can be of assistance to you before our next scheduled appointment.  Your next care management appointment is by telephone on 10/27/23 at 1pm   Please call the care guide team at [phone] if you need to cancel, schedule, or reschedule an appointment.   Please call 911 if you are experiencing a Mental Health or Behavioral Health Crisis or need someone to talk to.  Tillman Gardener, BSW [place]  [facility], [facility] Social Worker Direct Dial: [phone]  Fax: [phone] Website: [email or web address]

## 2023-10-15 DIAGNOSIS — R9431 Abnormal electrocardiogram [ECG] [EKG]: Secondary | ICD-10-CM | POA: Diagnosis not present

## 2023-10-20 ENCOUNTER — Encounter: Payer: Self-pay | Admitting: Licensed Clinical Social Worker

## 2023-10-20 DIAGNOSIS — I10 Essential (primary) hypertension: Secondary | ICD-10-CM | POA: Diagnosis not present

## 2023-10-20 DIAGNOSIS — R7689 Other specified abnormal immunological findings in serum: Secondary | ICD-10-CM | POA: Diagnosis not present

## 2023-10-20 DIAGNOSIS — M419 Scoliosis, unspecified: Secondary | ICD-10-CM | POA: Diagnosis not present

## 2023-10-20 DIAGNOSIS — Z6828 Body mass index (BMI) 28.0-28.9, adult: Secondary | ICD-10-CM | POA: Diagnosis not present

## 2023-10-20 DIAGNOSIS — M25562 Pain in left knee: Secondary | ICD-10-CM | POA: Diagnosis not present

## 2023-10-20 DIAGNOSIS — M797 Fibromyalgia: Secondary | ICD-10-CM | POA: Diagnosis not present

## 2023-10-20 DIAGNOSIS — B0059 Other herpesviral disease of eye: Secondary | ICD-10-CM | POA: Diagnosis not present

## 2023-10-20 DIAGNOSIS — Z79899 Other long term (current) drug therapy: Secondary | ICD-10-CM | POA: Diagnosis not present

## 2023-10-25 DIAGNOSIS — R9431 Abnormal electrocardiogram [ECG] [EKG]: Secondary | ICD-10-CM | POA: Diagnosis not present

## 2023-10-27 ENCOUNTER — Other Ambulatory Visit: Payer: Self-pay

## 2023-10-27 NOTE — Patient Outreach (Signed)
 Complex Care Management   Visit Note  10/27/2023  Name:  Melissa Sellers MRN: [id number] DOB: [birth year]  Situation: Referral received for Complex Care Management related to SDOH Barriers:  Food insecurity Disability I obtained verbal consent from Patient.  Visit completed with Patient  on the phone  Background:   Past Medical History:  Diagnosis Date   Allergy    Anxiety    Arthritis    Bladder spasm    Depression    Endometriosis    Frequency of urination    Heart murmur    Herpes simplex    Hypertension    Interstitial cystitis    Migraines    Nocturia    Pelvic pain in female    Seizures (HCC)    Urgency of urination     Assessment:  Patient has competed foodstamp application and is waiting on response. Patient has food bank list if needed. Patient plans to contact [employer] [facility] to assist with disability application. Patient received her Medicaid recertification and is completing. Goal completed.   SDOH Interventions    Flowsheet Row Patient Outreach Telephone from 10/13/2023 in [place] POPULATION HEALTH DEPARTMENT Patient Outreach Telephone from 06/02/2023 in [place] POPULATION HEALTH DEPARTMENT Office Visit from 05/05/2023 in [facility] [place] [employer] Visit from 03/02/2023 in [facility] [place] [employer] Visit from 02/25/2022 in [facility] [facility] [place] [employer] Visit from 10/01/2021 in [facility] [employer] [facility] [place]  SDOH Interventions        Food Insecurity Interventions Other (Comment)  [Food Radiographer, therapeutic and directed to apply for foodstamps] [employer] Provided -- -- -- --  Housing Interventions Intervention Not Indicated [employer] Provided -- -- -- --  Transportation Interventions Intervention Not Indicated  [Has vehicle but needs repairs and educated on [employer] transportation] [employer] Provided  [employer] car but it's broken  down before and been too expensive to fix] -- -- -- --  Utilities Interventions Intervention Not Indicated -- -- -- -- --  Depression Interventions/Treatment  -- -- Currently on Treatment PHQ2-9 Score <4 Follow-up Not Indicated Currently on Treatment Currently on Treatment  Financial Strain Interventions Intervention Not Indicated  [in debit due and disability expired and will reapply] -- -- -- -- --      Recommendation:   None  Follow Up Plan:   Patient has met all care management goals. Care Management case will be closed. Patient has been provided contact information should new needs arise.   Tillman Gardener, BSW [place]  [facility], [facility] Social Worker Direct Dial: [phone]  Fax: [phone] Website: [email or web address]

## 2023-10-27 NOTE — Patient Instructions (Signed)

## 2023-10-28 DIAGNOSIS — Z6828 Body mass index (BMI) 28.0-28.9, adult: Secondary | ICD-10-CM | POA: Diagnosis not present

## 2023-10-28 DIAGNOSIS — F411 Generalized anxiety disorder: Secondary | ICD-10-CM | POA: Diagnosis not present

## 2023-10-28 DIAGNOSIS — F32A Depression, unspecified: Secondary | ICD-10-CM | POA: Diagnosis not present

## 2023-11-16 DIAGNOSIS — M25562 Pain in left knee: Secondary | ICD-10-CM | POA: Diagnosis not present

## 2023-11-16 DIAGNOSIS — Z79899 Other long term (current) drug therapy: Secondary | ICD-10-CM | POA: Diagnosis not present

## 2023-11-16 DIAGNOSIS — R7689 Other specified abnormal immunological findings in serum: Secondary | ICD-10-CM | POA: Diagnosis not present

## 2023-11-16 DIAGNOSIS — B0059 Other herpesviral disease of eye: Secondary | ICD-10-CM | POA: Diagnosis not present

## 2023-11-16 DIAGNOSIS — I10 Essential (primary) hypertension: Secondary | ICD-10-CM | POA: Diagnosis not present

## 2023-11-16 DIAGNOSIS — M797 Fibromyalgia: Secondary | ICD-10-CM | POA: Diagnosis not present

## 2023-11-16 DIAGNOSIS — Z6828 Body mass index (BMI) 28.0-28.9, adult: Secondary | ICD-10-CM | POA: Diagnosis not present

## 2023-11-16 DIAGNOSIS — M419 Scoliosis, unspecified: Secondary | ICD-10-CM | POA: Diagnosis not present

## 2023-12-02 DIAGNOSIS — F411 Generalized anxiety disorder: Secondary | ICD-10-CM | POA: Diagnosis not present

## 2023-12-02 DIAGNOSIS — Z6828 Body mass index (BMI) 28.0-28.9, adult: Secondary | ICD-10-CM | POA: Diagnosis not present

## 2023-12-02 DIAGNOSIS — F32A Depression, unspecified: Secondary | ICD-10-CM | POA: Diagnosis not present

## 2023-12-10 DIAGNOSIS — F411 Generalized anxiety disorder: Secondary | ICD-10-CM | POA: Diagnosis not present

## 2023-12-10 DIAGNOSIS — Z6828 Body mass index (BMI) 28.0-28.9, adult: Secondary | ICD-10-CM | POA: Diagnosis not present

## 2023-12-10 DIAGNOSIS — F32A Depression, unspecified: Secondary | ICD-10-CM | POA: Diagnosis not present

## 2023-12-10 DIAGNOSIS — Z6829 Body mass index (BMI) 29.0-29.9, adult: Secondary | ICD-10-CM | POA: Diagnosis not present

## 2023-12-13 DIAGNOSIS — R7689 Other specified abnormal immunological findings in serum: Secondary | ICD-10-CM | POA: Diagnosis not present

## 2023-12-13 DIAGNOSIS — R030 Elevated blood-pressure reading, without diagnosis of hypertension: Secondary | ICD-10-CM | POA: Diagnosis not present

## 2023-12-13 DIAGNOSIS — Z6829 Body mass index (BMI) 29.0-29.9, adult: Secondary | ICD-10-CM | POA: Diagnosis not present

## 2023-12-13 DIAGNOSIS — B0059 Other herpesviral disease of eye: Secondary | ICD-10-CM | POA: Diagnosis not present

## 2023-12-13 DIAGNOSIS — I10 Essential (primary) hypertension: Secondary | ICD-10-CM | POA: Diagnosis not present

## 2023-12-13 DIAGNOSIS — M797 Fibromyalgia: Secondary | ICD-10-CM | POA: Diagnosis not present

## 2023-12-13 DIAGNOSIS — M25562 Pain in left knee: Secondary | ICD-10-CM | POA: Diagnosis not present

## 2023-12-13 DIAGNOSIS — Z79899 Other long term (current) drug therapy: Secondary | ICD-10-CM | POA: Diagnosis not present

## 2023-12-13 DIAGNOSIS — M419 Scoliosis, unspecified: Secondary | ICD-10-CM | POA: Diagnosis not present

## 2023-12-16 DIAGNOSIS — Z79899 Other long term (current) drug therapy: Secondary | ICD-10-CM | POA: Diagnosis not present
